# Patient Record
Sex: Female | Born: 2000 | Hispanic: Yes | Marital: Married | Smoking: Never smoker
Health system: Southern US, Community
[De-identification: ages and names within clinical notes are randomized; demographics above are authoritative.]

## PROBLEM LIST (undated history)

## (undated) DIAGNOSIS — Z789 Other specified health status: Secondary | ICD-10-CM

## (undated) DIAGNOSIS — O24419 Gestational diabetes mellitus in pregnancy, unspecified control: Secondary | ICD-10-CM

## (undated) HISTORY — DX: Other specified health status: Z78.9

## (undated) HISTORY — PX: WISDOM TOOTH EXTRACTION: SHX21

---
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
## 2021-04-07 ENCOUNTER — Other Ambulatory Visit: Payer: Self-pay

## 2021-04-07 ENCOUNTER — Ambulatory Visit (INDEPENDENT_AMBULATORY_CARE_PROVIDER_SITE_OTHER): Payer: Medicaid Other

## 2021-04-07 VITALS — BP 137/77 | HR 88 | Ht 65.0 in | Wt 230.1 lb

## 2021-04-07 DIAGNOSIS — Z3400 Encounter for supervision of normal first pregnancy, unspecified trimester: Secondary | ICD-10-CM | POA: Insufficient documentation

## 2021-04-07 DIAGNOSIS — O3680X0 Pregnancy with inconclusive fetal viability, not applicable or unspecified: Secondary | ICD-10-CM

## 2021-04-07 DIAGNOSIS — Z3401 Encounter for supervision of normal first pregnancy, first trimester: Secondary | ICD-10-CM | POA: Diagnosis not present

## 2021-04-07 DIAGNOSIS — Z3A12 12 weeks gestation of pregnancy: Secondary | ICD-10-CM

## 2021-04-07 DIAGNOSIS — Z789 Other specified health status: Secondary | ICD-10-CM

## 2021-04-07 DIAGNOSIS — Z3A11 11 weeks gestation of pregnancy: Secondary | ICD-10-CM

## 2021-04-07 MED ORDER — BLOOD PRESSURE KIT DEVI: 1.0000 | 0 refills | Status: AC

## 2021-04-07 NOTE — Progress Notes (Signed)
Patient was assessed and managed by nursing staff during this encounter. I have reviewed the chart and agree with the documentation and plan. I have also made any necessary editorial changes.  Catalina Antigua, MD 04/07/2021 12:24 PM

## 2021-04-07 NOTE — Progress Notes (Signed)
PRENATAL INTAKE SUMMARY  Ms. Sova presents today New OB Nurse Interview.  OB History    Gravida  1   Para      Term      Preterm      AB      Living  0     SAB      IAB      Ectopic      Multiple      Live Births  0          I have reviewed the patient's medical, obstetrical, social, and family histories, medications, and available lab results.  SUBJECTIVE She has no unusual complaints  OBJECTIVE Initial Physical Exam (New OB)  GENERAL APPEARANCE: alert, well appearing   ASSESSMENT Normal pregnancy  PLAN Prenatal care to be completed at [facility] labs to be completed at [facility] Provider visit Baby Scripts Ordered Blood Pressure kit Ordered U/S performed today reveals single live IUP at 2w3dby CSandborn FHR 177 PHQ 9 score: 4 GAD 7 score: 1

## 2021-04-17 ENCOUNTER — Other Ambulatory Visit: Payer: Self-pay

## 2021-04-17 ENCOUNTER — Ambulatory Visit (INDEPENDENT_AMBULATORY_CARE_PROVIDER_SITE_OTHER): Payer: Medicaid Other | Admitting: Student

## 2021-04-17 ENCOUNTER — Other Ambulatory Visit (HOSPITAL_COMMUNITY)
Admission: RE | Admit: 2021-04-17 | Discharge: 2021-04-17 | Disposition: A | Discharge status: Home / Self Care | Payer: Medicaid Other | Source: Ambulatory Visit | Attending: Student | Admitting: Student

## 2021-04-17 ENCOUNTER — Encounter: Payer: Self-pay | Admitting: Student

## 2021-04-17 VITALS — BP 119/83 | HR 94 | Wt 233.0 lb

## 2021-04-17 DIAGNOSIS — Z3A12 12 weeks gestation of pregnancy: Secondary | ICD-10-CM | POA: Insufficient documentation

## 2021-04-17 DIAGNOSIS — Z3401 Encounter for supervision of normal first pregnancy, first trimester: Secondary | ICD-10-CM

## 2021-04-17 DIAGNOSIS — Z3400 Encounter for supervision of normal first pregnancy, unspecified trimester: Secondary | ICD-10-CM

## 2021-04-17 NOTE — Progress Notes (Signed)
NOB [redacted]w[redacted]d U/S on 04/07/21 Pt has B/P Cuff at home.  NOB Intake done on 04/07/21 PHQ9=4 GAD 7 = 1  Genetic Screening: Desires and wants to know gender.   CC: Back pain onset Saturday. Pt states pain was bearable. Pt states she applied a heat compress and took tylenol still notes a little pain at this time.

## 2021-04-17 NOTE — Progress Notes (Signed)
Patient ID: Kristin Rios, female   DOB: 2001/03/25, 20 y.o.   MRN: [id number]

## 2021-04-17 NOTE — Progress Notes (Signed)
  Subjective:    Kristin Rios is being seen today for her first obstetrical visit.  This is not a planned pregnancy. She is at [redacted]w[redacted]d gestation. Her obstetrical history is significant for nothing. . Relationship with FOB: not married but FOB is invovled. Patient would like to try  intend to breast feed. Pregnancy history fully reviewed. FOB is in the [employer].   Patient reports no complaints.  Review of Systems:   Review of Systems  Constitutional: Negative.   HENT: Negative.   Respiratory: Negative.   Cardiovascular: Negative.   Gastrointestinal: Negative.   Genitourinary: Negative.   Neurological: Negative.     Objective:     BP 119/83   Pulse 94   Wt 233 lb (105.7 kg)   LMP 01/25/2021   BMI 38.77 kg/m  Physical Exam Constitutional:      Appearance: Normal appearance.  HENT:     Right Ear: Tympanic membrane normal.  Cardiovascular:     Pulses: Normal pulses.  Pulmonary:     Effort: Pulmonary effort is normal.  Abdominal:     General: Abdomen is flat.  Genitourinary:    General: Normal vulva.  Musculoskeletal:        General: Normal range of motion.  Skin:    General: Skin is warm and dry.  Neurological:     Mental Status: She is alert.     Maternal Exam:  Introitus: Normal vulva.      Assessment:    Pregnancy: G1P0 Patient Active Problem List   Diagnosis Date Noted  . Supervision of normal first teen pregnancy 04/07/2021       Plan:     Initial labs drawn. Prenatal vitamins. Problem list reviewed and updated. AFP3 discussed: will order. Role of ultrasound in pregnancy discussed; fetal survey: ordered. Amniocentesis discussed: declined. Follow up in 4 weeks. 75% of 30 min visit spent on counseling and coordination of care.  -genetic testing today -pap not needed due to age -[place] appt ordered  Anticipatory guidance about prenatal visits given including labs, ultrasounds, and testing. Discussed usage of Babyscripts and virtual visits as  additional source of managing and completing prenatal visits in midst of coronavirus and pandemic.   Encouraged to complete MyChart Registration for her ability to review results, send requests, and have questions addressed.  The nature of [place] [facility] Healthcare/Faculty Practice with multiple MDs and Advanced Practice Providers was explained to patient; also emphasized that residents, students are part of our team. Routine obstetric precautions reviewed. Encouraged to seek out care at office or emergency room [facility] preferred) for urgent and/or emergent concerns.    Charlesetta Garibaldi [facility] 04/17/2021

## 2021-04-18 LAB — CERVICOVAGINAL ANCILLARY ONLY
Chlamydia: NEGATIVE
Comment: NEGATIVE
Comment: NEGATIVE
Comment: NORMAL
Neisseria Gonorrhea: NEGATIVE
Trichomonas: NEGATIVE

## 2021-04-19 LAB — CULTURE, OB URINE

## 2021-04-19 LAB — URINE CULTURE, OB REFLEX

## 2021-04-22 LAB — OBSTETRIC PANEL, INCLUDING HIV
Antibody Screen: NEGATIVE
Basophils Absolute: 0 10*3/uL (ref 0.0–0.2)
Basos: 0 %
EOS (ABSOLUTE): 0 10*3/uL (ref 0.0–0.4)
Eos: 1 %
HIV Screen 4th Generation wRfx: NONREACTIVE
Hematocrit: 38.4 % (ref 34.0–46.6)
Hemoglobin: 12.9 g/dL (ref 11.1–15.9)
Hepatitis B Surface Ag: NEGATIVE
Immature Grans (Abs): 0 10*3/uL (ref 0.0–0.1)
Immature Granulocytes: 0 %
Lymphocytes Absolute: 1.4 10*3/uL (ref 0.7–3.1)
Lymphs: 18 %
MCH: 29.7 pg (ref 26.6–33.0)
MCHC: 33.6 g/dL (ref 31.5–35.7)
MCV: 89 fL (ref 79–97)
Monocytes Absolute: 0.4 10*3/uL (ref 0.1–0.9)
Monocytes: 6 %
Neutrophils Absolute: 5.6 10*3/uL (ref 1.4–7.0)
Neutrophils: 75 %
Platelets: 296 10*3/uL (ref 150–450)
RBC: 4.34 x10E6/uL (ref 3.77–5.28)
RDW: 13 % (ref 11.7–15.4)
RPR Ser Ql: NONREACTIVE
Rh Factor: POSITIVE
Rubella Antibodies, IGG: 2.79 index (ref >0.99)
WBC: 7.5 10*3/uL (ref 3.4–10.8)

## 2021-04-22 LAB — HEPATITIS C ANTIBODY: Hep C Virus Ab: <0.1 s/co ratio (ref 0.0–0.9)

## 2021-04-25 ENCOUNTER — Encounter: Payer: Self-pay | Admitting: Obstetrics and Gynecology

## 2021-04-29 ENCOUNTER — Encounter: Payer: Self-pay | Admitting: Obstetrics and Gynecology

## 2021-05-13 ENCOUNTER — Other Ambulatory Visit: Payer: Self-pay

## 2021-05-13 MED ORDER — PREPLUS 27-1 MG PO TABS: 1.0000 | ORAL_TABLET | Freq: Every day | ORAL | 13 refills | Status: AC

## 2021-05-13 MED ORDER — PRENATAL 27-0.8 MG PO TABS: 1.0000 | ORAL_TABLET | Freq: Every day | ORAL | 5 refills | Status: DC

## 2021-05-15 ENCOUNTER — Other Ambulatory Visit: Payer: Self-pay

## 2021-05-15 ENCOUNTER — Ambulatory Visit (INDEPENDENT_AMBULATORY_CARE_PROVIDER_SITE_OTHER): Payer: Medicaid Other

## 2021-05-15 VITALS — BP 128/74 | HR 75 | Wt 228.6 lb

## 2021-05-15 DIAGNOSIS — O99210 Obesity complicating pregnancy, unspecified trimester: Secondary | ICD-10-CM

## 2021-05-15 DIAGNOSIS — Z3400 Encounter for supervision of normal first pregnancy, unspecified trimester: Secondary | ICD-10-CM

## 2021-05-15 DIAGNOSIS — Z3A16 16 weeks gestation of pregnancy: Secondary | ICD-10-CM

## 2021-05-15 MED ORDER — ASPIRIN EC 81 MG PO TBEC: 81.0000 mg | DELAYED_RELEASE_TABLET | Freq: Every day | ORAL | 2 refills | Status: DC

## 2021-05-15 NOTE — Progress Notes (Signed)
   LOW-RISK PREGNANCY OFFICE VISIT  Patient name: Kristin Rios MRN [id number]  Date of birth: Apr 27, 2001 Chief Complaint:   Routine Prenatal Visit  Subjective:   Kristin Rios is a 20 y.o. G1P0 female at [redacted]w[redacted]d with an Estimated Date of Delivery: 10/24/21 being seen today for ongoing management of a low-risk pregnancy aeb has Supervision of normal first teen pregnancy on their problem list.  Patient presents today with no complaints.  Patient endorses fetal movement. Patient denies abdominal cramping or contractions.  Patient denies vaginal concerns including abnormal discharge, leaking of fluid, and bleeding.  Contractions: Not present. Vag. Bleeding: None.  Movement: Present.  Patient questions if she will have to pay out of pocket costs for her delivery.   Reviewed past medical,surgical, social, obstetrical and family history as well as problem list, medications and allergies.  Objective   Vitals:   05/15/21 1108  BP: 128/74  Pulse: 75  Weight: 228 lb 9.6 oz (103.7 kg)  Body mass index is 38.04 kg/m.  Total Weight Gain:9 lb 9.6 oz (4.355 kg)         Physical Examination:   General appearance: Well appearing, and in no distress  Mental status: Alert, oriented to person, place, and time  Skin: Warm & dry  Cardiovascular: Normal heart rate noted  Respiratory: Normal respiratory effort, no distress  Abdomen: Soft, gravid, nontender,   Pelvic: Cervical exam deferred           Extremities: Edema: None  Fetal Status: Fetal Heart Rate (bpm): 154  Movement: Present   No results found for this or any previous visit (from the past 24 hour(s)).  Assessment & Plan:  Low-risk pregnancy of a 20 y.o., G1P0 at [redacted]w[redacted]d with an Estimated Date of Delivery: 10/24/21   1. Encounter for supervision of normal pregnancy in teen primigravida, antepartum -Anticipatory guidance for upcoming appts. -Patient to schedule next appt in 4 weeks for an in-person visit. -AFP completed  today. -[place] scheduled for June 24th.   2. [redacted] weeks gestation of pregnancy -Doing well. -Informed that due to insurance [facility]) she will likely not have any OOP costs for delivery. -Reviewed who to contact and where to go for emergencies.  Given map to [facility].  3. Obesity in pregnancy -Reviewed risks factors for PreEclampsia including Hispanic Ethnicity, Primigravida, and BMI >30. -Reviewed research and recommendation for bASA initiation to reduce risk of PreE during pregnancy. -Will obtain HgB A1C today.     Meds:  Meds ordered this encounter  Medications   aspirin EC 81 MG tablet    Sig: Take 1 tablet (81 mg total) by mouth daily.    Dispense:  60 tablet    Refill:  2    Order Specific Question:   Supervising Provider    Answer:   Reva Bores [[id number]]   Labs/procedures today:  Lab Orders  AFP, Serum, Open Spina Bifida  Hemoglobin A1c    Reviewed: Preterm labor symptoms and general obstetric precautions including but not limited to vaginal bleeding, contractions, leaking of fluid and fetal movement were reviewed in detail with the patient.  All questions were answered.  Follow-up: Return in about 4 weeks (around 06/12/2021) for LROB.  Orders Placed This Encounter  Procedures   AFP, Serum, Open Spina Bifida   Hemoglobin A1c   Cherre Robins MSN, CNM 05/15/2021

## 2021-05-15 NOTE — Patient Instructions (Addendum)
Alpha-Fetoprotein Test Why am I having this test? The alpha-fetoprotein test is a lab test most commonly used for pregnant women to help screen for birth defects in their unborn baby. It can be used to screen for chromosome (DNA) abnormalities, problems with the brain or spinal cord, or problems with the abdominal wall of the unborn baby (fetus). The alpha-fetoprotein test may also be done for men or nonpregnant women to check for certain cancers. What is being tested? This test measures the amount of alpha-fetoprotein (AFP) in your blood. AFP is a protein that is made by the liver. Levels can be detected in the mother's blood during pregnancy, starting at 10 weeks and peaking at 16-18 weeks of the pregnancy. Abnormal levels can sometimes be a sign of a birth defect in the baby. Certain cancers can cause a high level of AFP in men and nonpregnant women. What kind of sample is taken? A blood sample is required for this test. It is usually collected by inserting a needle into a blood vessel.   How are the results reported? Your test results will be reported as values. Your health care provider will compare your results to normal ranges that were established after testing a large group of people (reference values). Reference values may vary among labs and hospitals. For this test, common reference values are:  Adult: Less than 40 ng/mL or less than 40 mcg/L (SI units).  Child younger than 1 year: Less than 30 ng/mL. If you are pregnant, the values may also vary based on how long you have been pregnant. What do the results mean? Results that are above the reference values in pregnant women may indicate the following for the baby:  Neural tube defects, such as abnormalities of the spinal cord or brain.  Abdominal wall defects.  Multiple pregnancy such as twins.  Fetal distress or fetal death. Results that are above the reference values in men or nonpregnant women may indicate:  Reproductive  cancers, such as ovarian or testicular cancer.  Liver cancer.  Liver cell death.  Other types of cancer. Very low levels of AFP in pregnant women may indicate Down syndrome for the baby. Talk with your health care provider about what your results mean. Questions to ask your health care provider Ask your health care provider, or the department that is doing the test:  When will my results be ready?  How will I get my results?  What are my treatment options?  What other tests do I need?  What are my next steps? Summary  The alpha-fetoprotein test is done on pregnant women to help screen for birth defects in their unborn baby.  Certain cancers can cause a high level of AFP in men and nonpregnant women.  For this test, a blood sample is usually collected by inserting a needle into a blood vessel.  Talk with your health care provider about what your results mean. This information is not intended to replace advice given to you by your health care provider. Make sure you discuss any questions you have with your health care provider. Document Revised: 06/14/2020 Document Reviewed: 06/14/2020 Elsevier Patient Education  2021 Elsevier Inc.  

## 2021-05-15 NOTE — Progress Notes (Signed)
ROB 16.6wks No complaints today. AFP pended.

## 2021-05-17 LAB — AFP, SERUM, OPEN SPINA BIFIDA
AFP MoM: 0.76
AFP Value: 22.3 ng/mL
Gest. Age on Collection Date: 16.6 weeks
Maternal Age At EDD: 19.8 yr
OSBR Risk 1 IN: 10000
Test Results:: NEGATIVE
Weight: 228 [lb_av]

## 2021-05-17 LAB — HEMOGLOBIN A1C
Est. average glucose Bld gHb Est-mCnc: 105 mg/dL
Hgb A1c MFr Bld: 5.3 % (ref 4.8–5.6)

## 2021-05-30 ENCOUNTER — Other Ambulatory Visit: Payer: Self-pay | Admitting: *Deleted

## 2021-05-30 ENCOUNTER — Other Ambulatory Visit: Payer: Self-pay

## 2021-05-30 ENCOUNTER — Other Ambulatory Visit: Payer: Self-pay | Admitting: Student

## 2021-05-30 ENCOUNTER — Ambulatory Visit: Payer: Medicaid Other | Attending: Student

## 2021-05-30 DIAGNOSIS — O99212 Obesity complicating pregnancy, second trimester: Secondary | ICD-10-CM | POA: Diagnosis not present

## 2021-05-30 DIAGNOSIS — Z363 Encounter for antenatal screening for malformations: Secondary | ICD-10-CM | POA: Diagnosis present

## 2021-05-30 DIAGNOSIS — Z3A12 12 weeks gestation of pregnancy: Secondary | ICD-10-CM

## 2021-05-30 DIAGNOSIS — E669 Obesity, unspecified: Secondary | ICD-10-CM | POA: Diagnosis not present

## 2021-05-30 DIAGNOSIS — R638 Other symptoms and signs concerning food and fluid intake: Secondary | ICD-10-CM

## 2021-05-30 DIAGNOSIS — Z3A19 19 weeks gestation of pregnancy: Secondary | ICD-10-CM | POA: Diagnosis not present

## 2021-06-06 ENCOUNTER — Encounter: Payer: Self-pay | Admitting: Obstetrics and Gynecology

## 2021-06-06 ENCOUNTER — Other Ambulatory Visit: Payer: Self-pay

## 2021-06-06 ENCOUNTER — Ambulatory Visit (INDEPENDENT_AMBULATORY_CARE_PROVIDER_SITE_OTHER): Payer: Medicaid Other | Admitting: Obstetrics and Gynecology

## 2021-06-06 VITALS — BP 119/74 | HR 79 | Wt 232.9 lb

## 2021-06-06 DIAGNOSIS — O99210 Obesity complicating pregnancy, unspecified trimester: Secondary | ICD-10-CM | POA: Insufficient documentation

## 2021-06-06 DIAGNOSIS — Z3402 Encounter for supervision of normal first pregnancy, second trimester: Secondary | ICD-10-CM

## 2021-06-06 NOTE — Progress Notes (Signed)
   PRENATAL VISIT NOTE  Subjective:  Kristin Rios is a 20 y.o. G1P0 at [redacted]w[redacted]d being seen today for ongoing prenatal care.  She is currently monitored for the following issues for this low-risk pregnancy and has Supervision of normal first teen pregnancy and Maternal obesity affecting pregnancy, antepartum on their problem list.  Patient reports no complaints.  Contractions: Not present. Vag. Bleeding: None.  Movement: Present. Denies leaking of fluid.   The following portions of the patient's history were reviewed and updated as appropriate: allergies, current medications, past family history, past medical history, past social history, past surgical history and problem list.   Objective:   Vitals:   06/06/21 0956  BP: 119/74  Pulse: 79  Weight: 232 lb 14.4 oz (105.6 kg)    Fetal Status: Fetal Heart Rate (bpm): 144   Movement: Present     General:  Alert, oriented and cooperative. Patient is in no acute distress.  Skin: Skin is warm and dry. No rash noted.   Cardiovascular: Normal heart rate noted  Respiratory: Normal respiratory effort, no problems with respiration noted  Abdomen: Soft, gravid, appropriate for gestational age.  Pain/Pressure: Absent     Pelvic: Cervical exam deferred        Extremities: Normal range of motion.  Edema: None  Mental Status: Normal mood and affect. Normal behavior. Normal judgment and thought content.   Assessment and Plan:  Pregnancy: G1P0 at [redacted]w[redacted]d 1. Supervision of normal first teen pregnancy in second trimester Patient is doing well without complaints   2. Maternal obesity affecting pregnancy, antepartum Continue ASA Follow up growth ultrasound scheduled  Preterm labor symptoms and general obstetric precautions including but not limited to vaginal bleeding, contractions, leaking of fluid and fetal movement were reviewed in detail with the patient. Please refer to After Visit Summary for other counseling recommendations.   No follow-ups  on file.  Future Appointments  Date Time Provider Department Center  06/27/2021  9:30 AM [facility] NURSE [facility]  06/27/2021  9:45 AM [facility] US5 [facility]    Catalina Antigua, MD

## 2021-06-06 NOTE — Progress Notes (Signed)
ROB 20 wks Reports left side abdominal pain and low abdominal pain yesterday-short duration. No pain now.

## 2021-06-27 ENCOUNTER — Ambulatory Visit: Payer: Medicaid Other

## 2021-07-15 ENCOUNTER — Ambulatory Visit (INDEPENDENT_AMBULATORY_CARE_PROVIDER_SITE_OTHER): Payer: Medicaid Other | Admitting: Obstetrics and Gynecology

## 2021-07-15 ENCOUNTER — Other Ambulatory Visit: Payer: Self-pay

## 2021-07-15 DIAGNOSIS — Z3402 Encounter for supervision of normal first pregnancy, second trimester: Secondary | ICD-10-CM

## 2021-07-15 NOTE — Progress Notes (Signed)
+   fetal movement. No complaints.  

## 2021-07-15 NOTE — Progress Notes (Signed)
   PRENATAL VISIT NOTE  Subjective:  Kristin Rios is a 20 y.o. G1P0 at [redacted]w[redacted]d being seen today for ongoing prenatal care.  She is currently monitored for the following issues for this low-risk pregnancy and has Supervision of normal first teen pregnancy and Maternal obesity affecting pregnancy, antepartum on their problem list.  Patient reports no complaints.  Contractions: Not present. Vag. Bleeding: None.  Movement: Present. Denies leaking of fluid.   The following portions of the patient's history were reviewed and updated as appropriate: allergies, current medications, past family history, past medical history, past social history, past surgical history and problem list.   Objective:   Vitals:   07/15/21 1114  BP: 133/79  Pulse: (!) 101  Weight: 247 lb (112 kg)    Fetal Status: Fetal Heart Rate (bpm): 151 Fundal Height: 25 cm Movement: Present     General:  Alert, oriented and cooperative. Patient is in no acute distress.  Skin: Skin is warm and dry. No rash noted.   Cardiovascular: Normal heart rate noted  Respiratory: Normal respiratory effort, no problems with respiration noted  Abdomen: Soft, gravid, appropriate for gestational age.  Pain/Pressure: Present     Pelvic: Cervical exam deferred        Extremities: Normal range of motion.  Edema: None  Mental Status: Normal mood and affect. Normal behavior. Normal judgment and thought content.   Assessment and Plan:  Pregnancy: G1P0 at [redacted]w[redacted]d  1. Supervision of normal first teen pregnancy in second trimester  Doing well [place] cone healthy baby website 2 hour GTT next visit.    Preterm labor symptoms and general obstetric precautions including but not limited to vaginal bleeding, contractions, leaking of fluid and fetal movement were reviewed in detail with the patient. Please refer to After Visit Summary for other counseling recommendations.   Return in about 4 weeks (around 08/12/2021), or For 2 hour GTT.  Future  Appointments  Date Time Provider Department Center  07/31/2021  8:30 AM [facility] NURSE [facility]  07/31/2021  8:45 AM [facility] US4 [facility]  08/13/2021  8:20 AM [facility] None    Venia Carbon, NP

## 2021-07-31 ENCOUNTER — Ambulatory Visit: Payer: Medicaid Other | Attending: Obstetrics and Gynecology

## 2021-07-31 ENCOUNTER — Other Ambulatory Visit: Payer: Self-pay | Admitting: Obstetrics and Gynecology

## 2021-07-31 ENCOUNTER — Encounter: Payer: Self-pay | Admitting: *Deleted

## 2021-07-31 ENCOUNTER — Other Ambulatory Visit: Payer: Self-pay

## 2021-07-31 ENCOUNTER — Ambulatory Visit: Payer: Medicaid Other | Admitting: *Deleted

## 2021-07-31 VITALS — BP 126/67 | HR 72

## 2021-07-31 DIAGNOSIS — R638 Other symptoms and signs concerning food and fluid intake: Secondary | ICD-10-CM

## 2021-07-31 DIAGNOSIS — O99212 Obesity complicating pregnancy, second trimester: Secondary | ICD-10-CM | POA: Diagnosis present

## 2021-07-31 DIAGNOSIS — O99210 Obesity complicating pregnancy, unspecified trimester: Secondary | ICD-10-CM

## 2021-07-31 DIAGNOSIS — Z3402 Encounter for supervision of normal first pregnancy, second trimester: Secondary | ICD-10-CM | POA: Insufficient documentation

## 2021-07-31 DIAGNOSIS — Z3A27 27 weeks gestation of pregnancy: Secondary | ICD-10-CM | POA: Diagnosis not present

## 2021-07-31 DIAGNOSIS — E669 Obesity, unspecified: Secondary | ICD-10-CM | POA: Diagnosis not present

## 2021-08-13 ENCOUNTER — Other Ambulatory Visit: Payer: Self-pay

## 2021-08-13 ENCOUNTER — Ambulatory Visit (INDEPENDENT_AMBULATORY_CARE_PROVIDER_SITE_OTHER): Payer: Medicaid Other | Admitting: Medical

## 2021-08-13 ENCOUNTER — Encounter: Payer: Self-pay | Admitting: Medical

## 2021-08-13 ENCOUNTER — Other Ambulatory Visit: Payer: Medicaid Other

## 2021-08-13 VITALS — BP 129/78 | HR 94 | Wt 246.8 lb

## 2021-08-13 DIAGNOSIS — O99213 Obesity complicating pregnancy, third trimester: Secondary | ICD-10-CM

## 2021-08-13 DIAGNOSIS — Z23 Encounter for immunization: Secondary | ICD-10-CM

## 2021-08-13 DIAGNOSIS — Z3A29 29 weeks gestation of pregnancy: Secondary | ICD-10-CM

## 2021-08-13 DIAGNOSIS — O24410 Gestational diabetes mellitus in pregnancy, diet controlled: Secondary | ICD-10-CM

## 2021-08-13 DIAGNOSIS — O99210 Obesity complicating pregnancy, unspecified trimester: Secondary | ICD-10-CM

## 2021-08-13 DIAGNOSIS — Z3402 Encounter for supervision of normal first pregnancy, second trimester: Secondary | ICD-10-CM

## 2021-08-13 NOTE — Progress Notes (Signed)
ROB 29.[redacted] wks GA GTT, CBC, HIV, RPR today TDAP offered and accepted Blood type B+ Depression and anxiety screen negative.

## 2021-08-13 NOTE — Progress Notes (Signed)
   PRENATAL VISIT NOTE  Subjective:  Kristin Rios is a 20 y.o. G1P0 at [redacted]w[redacted]d being seen today for ongoing prenatal care.  She is currently monitored for the following issues for this low-risk pregnancy and has Supervision of normal first teen pregnancy and Maternal obesity affecting pregnancy, antepartum on their problem list.  Patient reports no complaints.  Contractions: Not present. Vag. Bleeding: None.  Movement: Present. Denies leaking of fluid.   The following portions of the patient's history were reviewed and updated as appropriate: allergies, current medications, past family history, past medical history, past social history, past surgical history and problem list.   Objective:   Vitals:   08/13/21 0946  BP: 129/78  Pulse: 94  Weight: 246 lb 12.8 oz (111.9 kg)    Fetal Status: Fetal Heart Rate (bpm): 140   Movement: Present     General:  Alert, oriented and cooperative. Patient is in no acute distress.  Skin: Skin is warm and dry. No rash noted.   Cardiovascular: Normal heart rate noted  Respiratory: Normal respiratory effort, no problems with respiration noted  Abdomen: Soft, gravid, appropriate for gestational age.  Pain/Pressure: Absent     Pelvic: Cervical exam deferred        Extremities: Normal range of motion.  Edema: Trace  Mental Status: Normal mood and affect. Normal behavior. Normal judgment and thought content.   Assessment and Plan:  Pregnancy: G1P0 at [redacted]w[redacted]d 1. Supervision of normal first teen pregnancy in second trimester - Glucose Tolerance, 2 Hours w/1 Hour - CBC - HIV Antibody (routine testing w rflx) - RPR - Tdap vaccine greater than or equal to 7yo IM - Peds list given  - CBE website info given   2. Maternal obesity affecting pregnancy, antepartum - BASA  3. [redacted] weeks gestation of pregnancy  Preterm labor symptoms and general obstetric precautions including but not limited to vaginal bleeding, contractions, leaking of fluid and fetal  movement were reviewed in detail with the patient. Please refer to After Visit Summary for other counseling recommendations.   Return in about 2 weeks (around 08/27/2021) for LOB, In-Person.  Future Appointments  Date Time Provider Department Center  09/11/2021  1:30 PM [facility] NURSE [facility]  09/11/2021  1:45 PM [facility] US6 [facility]    Vonzella Nipple, PA-C

## 2021-08-13 NOTE — Patient Instructions (Signed)
AREA PEDIATRIC/FAMILY PRACTICE PHYSICIANS  Central/[place]outheast [place] ([address]) [facility] Chambliss, MD; Eniola, MD; Hale, MD; Hensel, MD; McDiarmid, MD; McIntyer, MD; Neal, MD; Walden, MD [address] [place], [place] [address] [phone] Mon-Fri 8:30-12:30, 1:30-5:00 Providers come to see babies at [facility] Accepting Medicaid [facility] providers who accept newborns: Koirala, MD; Morrow, MD; Wolters, MD [address], [place], [place] [address] [phone] Mon-Fri 8:00-5:30 Babies seen by providers at [facility] Does NOT accept Medicaid Please call early in hospitalization for appointment (limited availability)  [facility] Mulberry, MD [address] [place], [place] [address] [phone] Mon, Tue, Thur, Fri 8:30-5:00, Wed 10:00-7:00 (closed 1-2pm) Babies seen by [facility] providers Accepting Medicaid Rubin - Pediatrician Rubin, MD [address], [place], [place] [address] [phone] Mon-Fri 8:30-5:00, Sat 8:30-12:00 Provider comes to see babies at [facility] Accepting Medicaid Must have been referred from current patients or contacted office prior to delivery [facility] ([facility]) Brown, MD; Chandler, MD; Ettefagh, MD; Grant, MD; Lester, MD; McCormick, MD; McQueen, MD; Prose, MD; Simha, MD; Stanley, MD; Stryffeler, NP; Tebben, NP [address], [place], [place] [address] [phone] Mon, Tue, Thur, Fri 8:30-5:30, Wed 9:30-5:30, Sat 8:30-12:30 Babies seen by [facility] providers Accepting Medicaid Only accepting infants of first-time parents or siblings of current patients Hospital discharge coordinator will make follow-up appointment Jack Amos [address], [place], [place]  [address] [phone]   Fax - [phone] [facility] [address], [place], [place]  [address] Phone - [phone]   Fax - [phone] Shilpa Gosrani [address], [place], [place]  [address] [phone]  East/[place]ortheast [place] ([address]) [facility] Bates, MD; [place], MD; Cooper, Cox, MD; MD; Davis, MD; Dovico, MD; Ettefaugh, MD; Little, MD; Lowe, MD; Keiffer, MD; Melvin, MD; Sumner, MD; Williams, MD [address], [place], [place] [address] [phone] Mon-Fri 8:30-5:00 (extended evenings Mon-Thur as needed), Sat-Sun 10:00-1:00 Providers come to see babies at [facility] Accepting Medicaid for families of first-time babies and families with all children in the household age 3 and under. Must register with office prior to making appointment (M-F only). [facility] Henson, NP; Knapp, MD; Lalonde, MD; Tysinger, PA [address] [place], [place] [address] [phone] Mon-Fri 8:00-5:00 Babies seen by providers at [facility] Does NOT accept Medicaid/Commercial Insurance Only [facility] ([facility])  Artis, MD; Barnes, MD; Bratton, MD; Coccaro, MD; Lockett Gardner, MD; Kramer, MD; Marshall, MD; Netherton, MD; Poleto, MD; Skinner, MD [address] [place], [place] [address] [phone] Mon-Fri 8:30-5:30, Sat (Oct.-Mar.) 9:00-1:00 Babies seen by providers at [facility] Accepting Medicaid  [place] ([address]) [facility] Reid, MD; Warner, MD [address], [place], [place] [address] [phone] Mon-Fri 8:30-5:00, Sat 8:30-12:00 Providers come to see babies at [facility] Does NOT accept Medicaid [facility] Becker, PA; Hagler, MD; Scifres, PA; Sun, MD; Swayne, MD [address], [place], [place] [address] [phone] Mon-Fri 8:00-5:00 Babies seen by providers at [facility] Does NOT accept Medicaid Only accepting babies of parents who  are patients Please call early in hospitalization for appointment (limited availability) [facility] Clark, MD; Frye, MD; Kelleher, MD; Mack, NP; [place], MD; O'Keller, MD; Patterson, NP; Pudlo, MD; Puzio, MD; Thomas, MD; Tucker, MD; Twiselton, MD [address], [place], [place] [address] [phone] Mon-Fri 8:00-5:00, Sat 9:00-12:00 Providers come to see babies at [facility] Does NOT accept Medicaid  [place] ([address]) [facility] providers accepting new patients: Brake, NP; Wharton, PA [address], [place], [place] [address] [phone] Mon-Fri 8:00-5:00 Babies seen by providers at [facility] Does NOT accept Medicaid Only accepting babies of parents who are patients Please call early in hospitalization for appointment (limited availability) [facility] Gay, MD; Quinlan, MD [address] [place], [place] [address] [phone] (press 1 to schedule appointment) Mon-Fri 8:00-5:00 Providers come to see babies at [facility] Does NOT accept Medicaid [facility] Mazer, MD [address] [place], [place] [address] [phone] Mon-Fri 8:30-5:00 (lunch 12:30-1:00), extended hours by appointment only Wed 5:00-6:30 Babies seen by [facility] providers Accepting Medicaid [facility] Banks, MD; Jordan, MD; Koberlein, MD [address], [place], [place] [address] [phone] Mon-Fri 8:00-5:00 Babies seen by [facility] providers Does NOT accept Medicaid [facility] Parker, MD; Hunter, MD; Wallace, DO [address] [place], [place] [address] [phone] Mon-Fri 8:00-5:00 Babies seen by [facility] providers Does NOT accept Medicaid [facility] Brandon, PA; Brecken, PA; Christy, NP; Dees, MD; DeClaire, MD; DeWeese, MD; Hansen, NP; Mills, NP; Parrish, NP; Smoot, NP; Summer, MD; Vapne,  MD [address] [place], [place] [address] [phone] Mon-Fri 8:30-5:00, Sat 10:00-1:00 Providers come to see babies at [facility] Does NOT accept Medicaid Free prenatal information session Tuesdays at 4:45pm [facility] Bouska, MD; Gordon, PA; Jeffery, PA; Weber, PA [address] [place] [address] [phone] Mon-Fri 7:30-5:30 Babies seen by [facility] providers [facility] [address], [place], [place]  [address] [phone]   Fax - [phone]  [place] ([address] & [address]) [facility] [place] [address] [place], [place] [address] [phone] Mon-Thur 8:00-6:00 Providers come to see babies at [facility] Accepting Medicaid [facility] Anderson, NP; Badger, MD; Beal, PA; Spencer, PA [address] [place], [place] [address] [phone] Mon-Thur 7:30-7:30, Fri 7:30-4:30 Babies seen by [facility] providers Accepting Medicaid [facility] Agbuya, MD; Klett, NP; Romgoolam, MD [address], [place], [place] [address] [phone] Mon-Fri 8:30-5:00, Sat 8:30-12:00 Providers come to see babies at [facility] Accepting Medicaid Must have "Meet & Greet" appointment at office prior to delivery [facility] ([facility] McCord, MD; Wallace, MD; Wood, MD [address], [place], [place] [address] [phone] Mon-Wed 8:00-6:00, Thur-Fri 8:00-5:00, Sat 9:00-12:00 Providers come to see babies at [facility] Does NOT accept Medicaid Only accepting siblings of current patients [facility]  [address], [place], [place]  [address] [phone]   Fax - [phone] [facility] [address], [place], [place]  [address] [phone]   Fax -  [phone]  [place] ([address] & [address]) [facility] Cirigliano, DO; Matthews, DO [address] [place],  [address] [phone] Mon-Fri 7:00-5:00 Babies seen by [facility] providers Does NOT accept Medicaid [facility] Briscoe, MD; Howley, PA; [place]ira, PA [address], [place],  [address] [phone] Mon-Fri 8:00-5:00 Babies seen by [facility] providers Accepting Medicaid [facility] Boyd, MD; Church, PA; Jones, NP; Osborn, PA [address], [place],  [address] [phone] Mon-Fri 8:00-5:00 Babies seen by providers at [facility] Accepting Medicaid  [place] ([address]) [facility] [place] Wendling, DO [address] [place], [place] [address] [phone] Mon-Fri 8:00-5:00 Babies seen by [facility] providers Does NOT accept Medicaid Limited availability, please call early in hospitalization to schedule follow-up [facility] Calderon, PA; Cummings, MD; Dillard, MD; Martin, PA; Olson, MD; VanDeven, PA [address], [place], [place] [address] [phone] Mon-Fri 8:30-5:00, Sat 9:00-12:00 Babies seen by providers at [facility] Accepting Medicaid Please register online then schedule online or call office [email or web address] Hunter, NP; Kumar, MD; Martin Rogers, PA [address], [place], [place] [address] [phone] Mon-Fri 8:00-5:00 Babies seen by providers at [facility] Accepting Medicaid [facility] ([facility]) [place], MD; Kristi Fleenor, NP; West, MD [address], [place], [place] [address] [phone] Mon-Fri 8:00-5:30, Sat&Sun by appointment (phones open at  8:30) Babies seen by [facility] providers Accepting Medicaid Must be a first-time baby or sibling of current patient [facility] [place]  [address], [place], [place]  [address] [phone]   Fax - [phone]  [place] ([address]) [facility] Brown, PA; Cowen, PA; Rice, MD; Helton, PA; Spry, MD [address] [place], [place] [address] [phone] Mon-Thur 8:00-7:00, Fri 8:00-5:00, Sat 8:00-12:00, Sun 9:00-12:00 Babies seen by [facility] providers Accepting Medicaid [facility] Coe-Goins, MD; Marshall, MD; Pierre-Louis, MD [address] [place] [address], [place], [place] [address] [phone] Mon-Thur 8:00-5:00 Babies seen by providers at [facility] Accepting Medicaid [facility] Bratton, MD; Coe-Goins, MD; Hayes, MD; Lewis, MD; List, MD; Lott, MD; Marshall, MD; Moran, MD; O'Neal, MD; Pierre-Louis, MD; Pitonzo, MD; Scholer, MD; Spangle, MD [address] [place], [place] [address] [phone] Mon-Fri 8:00-5:30, Sat (Oct.-Mar.) 9:00-1:00 Babies seen by providers at [facility] Accepting Medicaid Must fill out new patient packet, available online at [email or web address] [facility] ([facility]) Friddle, NP; Harris, NP; Kelly, NP; Logan, MD; Melvin, PA; Poth, MD; Ramadoss, MD; Stanton, NP [address], [place], [place] [address] [phone] Mon-Thur 8:00-5:30, Fri 8:00-5:00 Babies seen by providers at [facility] Accepting Medicaid  [place] ([address]) [facility] Dixon, PA; [place], MD; Pickard, MD; Tapia, PA [address], [place], [place] [address] [phone] Mon-Fri 8:00-5:00 Babies seen by providers at [facility] Accepting Medicaid   [place] ([address]) [facility] [place] Masneri, DO; Meyers, MD; Nelson, PA [address], [place], [place] [address] [phone] Mon-Fri 8:00-5:00 Babies seen by providers at [facility] Does NOT accept Medicaid Limited appointment availability, please call early in hospitalization  [facility] [place] Kunedd, DO; McGowen, MD [address], [place], [place] [address] [phone] Mon-Fri 8:00-5:00 Babies seen by [facility] providers Does NOT accept Medicaid [facility] [place] Cameron, MD; MacDonald, MD; Michaels, PA; Nayak, MD [address] [place] [address], [place], [place] [address] [phone] Mon-Fri 8:00-5:00 After hours clinic ([address] [place],  [address]) [phone] Mon-Fri 5:00-8:00, Sat 12:00-6:00, Sun 10:00-4:00 Babies seen by [facility] providers Accepting Medicaid [facility] [place] [address], [place], [place]  [address] [phone]   Fax - [phone]  [place] ([address]) [facility] [place] [facility] Andy, MD [address], [place], [place] [address] [phone] Mon-Fri 8:00-5:00 Babies seen by [facility] providers Does NOT accept Medicaid [facility] [place] ([facility] [place]) Eksir, MD [address], [place], [place] [address] [phone] Mon-Thur 8:00-7:00, Fri 8:00-5:00, Sat 8:00-12:00 Babies seen by providers at [facility] Accepting Medicaid - but does not have vaccinations in office (must be received elsewhere) Limited availability, please call early in hospitalization  [place] ([address]) [facility]  Charlene Flemming, MD [address], [place] [address] [phone]  Fax [phone]  [facility]  Kimberly Newton, MD, Annamarie Streilein, PA, Carla Hampton, PA [address] [place], [place]  [address] [phone] [facility]  [address], [place], [place] [address] [phone] [address], [place], [place] [address] [phone] (West Office)  [facility] [address], [place], [place] [address] [phone] [facility] [address], [place], [place] [address] [phone] [facility] [address], [place], [place] [address] [phone] [facility] [address], [place], [place] [address] [phone] [facility] [address], [place], [place] [address] [phone] [facility] [address], [place], [place] [address] [phone] [facility]  [address], [place], [place] [address] [phone] [facility] [address] [place] [address], [place], [place] [address] [phone] [facility] [address], [place], [place] [address] [phone] [facility] [address], [place],  [address] [phone]  Childbirth Education Options: [facility] Classes:  Childbirth education classes can help you get ready for a positive parenting experience. You can also meet other expectant parents and get free stuff for your baby. Each class runs for five weeks on the same night and costs $45 for the mother-to-be and her support person. Medicaid covers the cost if you are eligible. Call [phone] to register.  [facility] Childbirth Education: Classes can vary in availability and schedule is subject to change. For most up-to-date information please visit [email or web address] to review and register.        

## 2021-08-14 ENCOUNTER — Other Ambulatory Visit: Payer: Self-pay | Admitting: *Deleted

## 2021-08-14 DIAGNOSIS — O24419 Gestational diabetes mellitus in pregnancy, unspecified control: Secondary | ICD-10-CM

## 2021-08-14 LAB — CBC
Hematocrit: 38.2 % (ref 34.0–46.6)
Hemoglobin: 12.5 g/dL (ref 11.1–15.9)
MCH: 28 pg (ref 26.6–33.0)
MCHC: 32.7 g/dL (ref 31.5–35.7)
MCV: 86 fL (ref 79–97)
Platelets: 245 10*3/uL (ref 150–450)
RBC: 4.47 x10E6/uL (ref 3.77–5.28)
RDW: 13.1 % (ref 11.7–15.4)
WBC: 9.4 10*3/uL (ref 3.4–10.8)

## 2021-08-14 LAB — GLUCOSE TOLERANCE, 2 HOURS W/ 1HR
Glucose, 1 hour: 176 mg/dL (ref 65–179)
Glucose, 2 hour: 128 mg/dL (ref 65–152)
Glucose, Fasting: 98 mg/dL — ABNORMAL HIGH (ref 65–91)

## 2021-08-14 LAB — RPR: RPR Ser Ql: NONREACTIVE

## 2021-08-14 LAB — HIV ANTIBODY (ROUTINE TESTING W REFLEX): HIV Screen 4th Generation wRfx: NONREACTIVE

## 2021-08-14 MED ORDER — ACCU-CHEK GUIDE W/DEVICE KIT: PACK | 0 refills | Status: DC

## 2021-08-14 MED ORDER — ACCU-CHEK GUIDE VI STRP: ORAL_STRIP | 5 refills | Status: DC

## 2021-08-14 MED ORDER — ACCU-CHEK SOFTCLIX LANCETS MISC: 5 refills | Status: DC

## 2021-08-14 NOTE — Addendum Note (Signed)
Addended by: Marny Lowenstein on: 08/14/2021 03:13 PM   Modules accepted: Orders

## 2021-08-27 ENCOUNTER — Other Ambulatory Visit: Payer: Self-pay

## 2021-08-27 ENCOUNTER — Ambulatory Visit (INDEPENDENT_AMBULATORY_CARE_PROVIDER_SITE_OTHER): Payer: Medicaid Other | Admitting: Women's Health

## 2021-08-27 VITALS — BP 136/81 | HR 89 | Wt 247.0 lb

## 2021-08-27 DIAGNOSIS — Z23 Encounter for immunization: Secondary | ICD-10-CM | POA: Diagnosis not present

## 2021-08-27 DIAGNOSIS — O99210 Obesity complicating pregnancy, unspecified trimester: Secondary | ICD-10-CM

## 2021-08-27 DIAGNOSIS — Z3403 Encounter for supervision of normal first pregnancy, third trimester: Secondary | ICD-10-CM

## 2021-08-27 DIAGNOSIS — O24410 Gestational diabetes mellitus in pregnancy, diet controlled: Secondary | ICD-10-CM

## 2021-08-27 DIAGNOSIS — O99213 Obesity complicating pregnancy, third trimester: Secondary | ICD-10-CM

## 2021-08-27 DIAGNOSIS — Z3A31 31 weeks gestation of pregnancy: Secondary | ICD-10-CM

## 2021-08-27 NOTE — Progress Notes (Signed)
ROB.  C/o upper quadrant stomach pain 4/10 x 3 days.  FLU vaccine given in LD, tolerated well.  BS log on her phone.

## 2021-08-27 NOTE — Patient Instructions (Signed)
Maternity Assessment Unit (MAU)  The Maternity Assessment Unit (MAU) is located at the [facility]. The address is: [address] [place], [place], [place] [address]. Please see map below for additional directions.    The Maternity Assessment Unit is designed to help you during your pregnancy, and for up to 6 weeks after delivery, with any pregnancy- or postpartum-related emergencies, if you think you are in labor, or if your water has broken. For example, if you experience nausea and vomiting, vaginal bleeding, severe abdominal or pelvic pain, elevated blood pressure or other problems related to your pregnancy or postpartum time, please come to the Maternity Assessment Unit for assistance.       AREA PEDIATRIC/FAMILY PRACTICE PHYSICIANS  [facility] [address] [place], [place] [address] Phone - [phone]   Fax - [place] [address] [place], [place]  [address] Phone - [phone]   Fax - [phone]  [place] [address] [place] [address] [place], [place]  [address] Phone - [phone]   Fax - [phone]  [facility] [address] [place], [place]  [address] Phone - [phone]   Fax - [phone]  [place] [address], [place], [place]  [address] Phone - [phone]   Fax - [place] [address] [place], [place]  [address] Phone - [phone]   Fax - [place] [facility] [place] [address], [place], [place]  [address] Phone - [phone]   Fax - [phone]  [place] [address] [place], [place], [place]  [address] Phone - [phone]   Fax - [place] [address] [place], [place]  [address] Phone - [phone]   Fax -  [phone] [facility] [place]. [address] [place], [place]  [address] Phone - [phone]   Fax - [phone]  [place] [address] [place], [place]  [address] Phone - [phone]   Fax - [phone]  [facility] [place], [place], [place]  [address] Phone - [phone]   Fax - [place] [address], [place], [place]  [address] Phone - [phone]   Fax - [phone]  [facility] [address], [place], [place]  [address] Phone - [place], [place]  [address] Phone - [phone]   Fax - [place] [address], [place], [place]  [address] Phone - [phone]   Fax - [phone]  [place] [address] [place], [place]  [address] Phone - [phone]   Fax - [place]. [place], [place]  [address] Phone - [phone]   Fax - [place], [place], [place]  [address] Phone - [phone]   Fax - [place] [address], [address] [place], [place]  [address] Phone - [phone]   Fax - [phone]  [place]UB[place] [address] [place], [place]  [address] Phone - [phone]   Fax - [place] [address] [place],   [address] Phone - [phone]  Fax - [phone]  [facility] [address] [place], [place]  [address] Phone - [phone]   Fax - [phone] Ger[place] [id number] [place], [place]  [address] Phone - [phone]   Fax - [phone]  [facility] [address] [place], [place]  [address] Phone - [phone]   Fax - [phone]  [facility]  FAMILY [facility]IC[facility] - [place] [address] [place], [place]  [address] Phone - [phone]   Fax - [phone]        Preeclampsia and Eclampsia Preeclampsia is a serious condition that may develop during pregnancy. This condition involves high blood pressure during pregnancy and causes symptoms such as headaches, vision changes, and increased swelling in the legs, hands, and face. Preeclampsia occurs after 20 weeks of pregnancy. Eclampsia is a seizure that happens from worsening preeclampsia. Diagnosing and managing preeclampsia early is important. If not treated early, it can cause serious problems for mother and baby. There is no cure for this condition. However, during pregnancy, delivering the baby may be the best treatment for preeclampsia or eclampsia. For most women, symptoms of preeclampsia and eclampsia go away after giving birth. In rare cases, a woman may develop preeclampsia or eclampsia after giving birth. This usually occurs within 48 hours after childbirth but may occur up to 6 weeks after giving birth. What are the causes? The cause of this condition is not known. What increases the risk? The following factors make you more likely to develop preeclampsia: Being pregnant for the first time or being pregnant with multiples. Having had preeclampsia or a condition called hemolysis, elevated liver enzymes, and low platelet count (HELLP)syndrome during a past pregnancy. Having a family history of preeclampsia. Being older than age 97. Being obese. Becoming pregnant through fertility treatments. Conditions that reduce blood flow or oxygen to your placenta and baby may also increase your risk. These include: High blood pressure before, during, or immediately following pregnancy. Kidney disease. Diabetes. Blood clotting disorders. Autoimmune diseases, such as lupus. Sleep apnea. What are the signs or symptoms? Common symptoms of this condition include: A  severe, throbbing headache that does not go away. Vision problems, such as blurred or double vision and light sensitivity. Pain in the stomach, especially the right upper region. Pain in the shoulder. Other symptoms that may develop as the condition gets worse include: Sudden weight gain because of fluid buildup in the body. This causes swelling of the face, hands, legs, and feet. Severe nausea and vomiting. Urinating less than usual. Shortness of breath. Seizures. How is this diagnosed? Your health care provider will ask you about symptoms and check for signs of preeclampsia during your prenatal visits. You will also have routine tests, including: Checking your blood pressure. Urine tests to check for protein. Blood tests to assess your organ function. Monitoring your baby's heart rate. Ultrasounds to check fetal growth. How is this treated? You and your health care provider will determine the treatment that is best for you. Treatment may include: Frequent prenatal visits to check for preeclampsia. Medicine to lower your blood pressure. Medicine to prevent seizures. Low-dose aspirin during your pregnancy. Staying in the hospital, in severe cases. You will be given medicines to control your blood pressure and the amount of fluids in your body. Delivering your baby. Work with your health care provider to manage any chronic health conditions, such as diabetes or kidney problems. Also, work with your health care provider to manage weight gain  during pregnancy. Follow these instructions at home: Eating and drinking Drink enough fluid to keep your urine pale yellow. Avoid caffeine. Caffeine may increase blood pressure and heart rate and lead to dehydration. Reduce the amount of salt that you eat. Lifestyle Do not use any products that contain nicotine or tobacco. These products include cigarettes, chewing tobacco, and vaping devices, such as e-cigarettes. If you need help quitting, ask  your health care provider. Do not use alcohol or drugs. Avoid stress as much as possible. Rest and get plenty of sleep. General instructions  Take over-the-counter and prescription medicines only as told by your health care provider. When lying down, lie on your left side. This keeps pressure off your major blood vessels. When sitting or lying down, raise (elevate) your feet. Try putting pillows underneath your lower legs. Exercise regularly. Ask your health care provider what kinds of exercise are best for you. Check your blood pressure as often as recommended by your health care provider. Keep all prenatal and follow-up visits. This is important. Contact a health care provider if: You have symptoms that may need treatment or closer monitoring. These include: Headaches. Stomach pain or nausea and vomiting. Shoulder pain. Vision problems, such as spots in front of your eyes or blurry vision. Sudden weight gain or increased swelling in your face, hands, legs, and feet. Increased anxiety or feeling of impending doom. Signs or symptoms of labor. Get help right away if: You have any of the following symptoms: A seizure. Shortness of breath or trouble breathing. Trouble speaking or slurred speech. Fainting. Chest pain. These symptoms may represent a serious problem that is an emergency. Do not wait to see if the symptoms will go away. Get medical help right away. Call your local emergency services (911 in the U.S[place] Do not drive yourself to the hospital. Summary Preeclampsia is a serious condition that may develop during pregnancy. Diagnosing and treating preeclampsia early is very important. Keep all prenatal and follow-up visits. This is important. Get help right away if you have a seizure, shortness of breath or trouble breathing, trouble speaking or slurred speech, chest pain, or fainting. This information is not intended to replace advice given to you by your health care provider.  Make sure you discuss any questions you have with your health care provider. Document Revised: 08/15/2020 Document Reviewed: 08/15/2020 Elsevier Patient Education  2022 Elsevier Inc.       Gestational Diabetes Mellitus, Self-Care Caring for yourself after a diagnosis of gestational diabetes mellitus means keeping your blood sugar under control. This can be done through nutrition, exercise, lifestyle changes, insulin and other medicines, and support from your health care team. Your health care provider will set individualized treatment goals for you. What are the risks? If left untreated, gestational diabetes can cause problems for mother and baby. For the mother Women who get gestational diabetes are more likely to: Have labor induced and deliver early. Have problems during labor and delivery, if the baby is larger than normal. This includes difficult labor and damage to the birth canal. Have a cesarean delivery. Have problems with blood pressure, including high blood pressure and preeclampsia. Get it again if they become pregnant. Develop type 2 diabetes in the future. For the baby Gestational diabetes that is not treated can cause the baby to have: Low blood glucose (hypoglycemia). Larger-than-normal body size (macrosomia). Breathing problems. How to monitor blood glucose Check your blood glucose every day and as often as told by your health care provider. To  do this: Wash your hands with soap and water for at least 20 seconds. Prick the side of your finger (not the tip) with the lancet. Use a different finger each time. Gently rub the finger until a small drop of blood appears. Follow instructions that come with your meter for inserting the test strip, applying blood to the strip, and getting the result. Write down your result and any notes. Blood glucose goals are: 95 mg/dL (5.3 mmol/L) when fasting. 140 mg/dL (7.8 mmol/L) 1 hour after a meal. 120 mg/dL (6.7 mmol/L) 2 hours  after a meal. Follow these instructions at home: Medicines Take over-the-counter and prescription medicines only as told by your health care provider. If your health care provider prescribed insulin or other diabetes medicines: Take them every day. Do not run out of insulin or other medicines. Plan ahead so you always have them available. Eating and drinking  Follow instructions from your health care provider about eating or drinking restrictions. See a diet and nutrition expert (registered dietician) to help you create an eating plan that helps control your diabetes. The foods in this plan will include: Lean proteins. Complex carbohydrates. These are carbohydrates that contain fiber, have a lot of nutrients, and are digested slowly. They include dried beans, nuts, and whole grain breads, cereals, or pasta. Fresh fruits and vegetables. Low-fat dairy products. Healthy fats. Eat healthy snacks between nutritious meals. Drink enough fluid to keep your urine pale yellow. Keep a record of the carbohydrates that you eat. To do this: Read food labels. Learn the standard serving sizes of foods. Make a sick day plan with your health care provider before you get sick. Follow this plan whenever you cannot eat or drink as usual. Activity Do exercises as told by your health care provider. Do 30 or more minutes of physical activity a day, or as much physical activity as your health care provider recommends. It may help to control blood glucose levels after a meal if you: Do 10 minutes of exercise after each meal. Start this exercise 30 minutes after the meal. If you start a new exercise or activity, work with your health care provider to adjust your insulin, other medicines, or food as needed. Lifestyle Do not drink alcohol. Do not use any products that contain nicotine or tobacco, such as cigarettes, e-cigarettes, and chewing tobacco. If you need help quitting, ask your health care provider. Learn  to manage stress. If you need help with this, ask your health care provider. Body care Keep your vaccines up to date. Practice good oral hygiene. To do this: Clean your teeth and gums two times a day. Floss one or more times a day. Visit your dentist one or more times every 6 months. Stay at a healthy weight while you are pregnant. Your expected weight gain depends on your BMI (body mass index) before pregnancy. General instructions Talk with your health care provider about the risk for high blood pressure during pregnancy (preeclampsia and eclampsia). Share your diabetes management plan with people in your workplace, school, and household. Check your urine for ketones when sick and as told by your health care provider. Ketones are made by the liver when a lack of glucose forces the body to use fat for energy. Carry a medical alert card or wear medical alert jewelry that says you have gestational diabetes. Keep all follow-up visits. This is important. Get care after delivery Have your blood glucose level checked with an oral glucose tolerance test (OGTT) 4-12 weeks  after delivery. Get screened for diabetes at least every 3 years, or as often as told by your health care provider. Where to find more information American Diabetes Association (ADA): diabetes.org Association of Diabetes Care & Education Specialists (ADCES): diabeteseducator.org Centers for Disease Control and Prevention (CDC): TonerPromos.no American Pregnancy Association: americanpregnancy.org U.S. Department of Agriculture MyPlate: WrestlingReporter.dk Contact a health care provider if: Your blood glucose is above your target for two tests in a row. You have a fever. You have been sick for 2 days or more and are not getting better. You have either of these problems for more than 6 hours: Vomiting every time you eat or drink. Diarrhea. Get help right away if you: Become confused or cannot think clearly. Have trouble breathing. Have  moderate or high ketones in your urine. Feel your baby is not moving as usual. Develop unusual discharge or bleeding from your vagina. Start having early (premature) contractions. Contractions may feel like a tightening in your lower abdomen Have a severe headache. These symptoms may represent a serious problem that is an emergency. Do not wait to see if the symptoms will go away. Get medical help right away. Call your local emergency services (911 in the U.S.). Do not drive yourself to the hospital. Summary Check your blood glucose every day during your pregnancy. Do this as often as told by your health care provider. Take insulin or other diabetes medicines every day, if your health care provider prescribed them. Have your blood glucose level checked 4-12 weeks after delivery. Keep all follow-up visits. This is important. This information is not intended to replace advice given to you by your health care provider. Make sure you discuss any questions you have with your health care provider. Document Revised: 04/29/2020 Document Reviewed: 04/29/2020 Elsevier Patient Education  2022 [employer].

## 2021-08-27 NOTE — Progress Notes (Signed)
Subjective:  Kristin Rios is a 20 y.o. G1P0 at 52w5dbeing seen today for ongoing prenatal care.  She is currently monitored for the following issues for this high-risk pregnancy and has Supervision of normal first teen pregnancy; Maternal obesity affecting pregnancy, antepartum; and Gestational diabetes mellitus (GDM) in third trimester on their problem list.  Patient reports no complaints.  Contractions: Not present. Vag. Bleeding: None.  Movement: Present. Denies leaking of fluid.   The following portions of the patient's history were reviewed and updated as appropriate: allergies, current medications, past family history, past medical history, past social history, past surgical history and problem list. Problem list updated.  Objective:   Vitals:   08/27/21 1344  BP: 136/81  Pulse: 89  Weight: 247 lb (112 kg)    Fetal Status: Fetal Heart Rate (bpm): 145   Movement: Present     General:  Alert, oriented and cooperative. Patient is in no acute distress.  Skin: Skin is warm and dry. No rash noted.   Cardiovascular: Normal heart rate noted  Respiratory: Normal respiratory effort, no problems with respiration noted  Abdomen: Soft, gravid, appropriate for gestational age. Pain/Pressure: Present     Pelvic: Vag. Bleeding: None     Cervical exam deferred        Extremities: Normal range of motion.  Edema: Trace  Mental Status: Normal mood and affect. Normal behavior. Normal judgment and thought content.   Urinalysis:      Assessment and Plan:  Pregnancy: G1P0 at 341w5d1. Supervision of normal first teen pregnancy in third trimester -peds list given  2. Maternal obesity affecting pregnancy, antepartum - Comp Met (CMET) - Protein / creatinine ratio, urine  3. Diet controlled gestational diabetes mellitus (GDM) in third trimester - patient has not yet been seen for GDM education, not scheduled until 09/10/2021, message to front desk to schedule ASAP for education -pt has  been randomly taking glucose measurements throughout the day for past week - one value at 177, most values 125 or under. Discussed timing of blood glucose checks, limiting carbohydrates/sugars, walks after meals, will schedule in one week with MD only to review blood sugar logs -growth USKoreacheduled 09/11/2021  4. [redacted] weeks gestation of pregnancy  Preterm labor symptoms and general obstetric precautions including but not limited to vaginal bleeding, contractions, leaking of fluid and fetal movement were reviewed in detail with the patient. I discussed the assessment and treatment plan with the patient. The patient was provided an opportunity to ask questions and all were answered. The patient agreed with the plan and demonstrated an understanding of the instructions. The patient was advised to call back or seek an in-person office evaluation/[facility] or concerning symptoms. Please refer to After Visit Summary for other counseling recommendations.  Return in about 1 week (around 09/03/2021) for in-person HOB/MD ONLY.   [place] Calia, NiGerrie NordmannNP

## 2021-08-28 LAB — COMPREHENSIVE METABOLIC PANEL
ALT: 13 IU/L (ref 0–32)
AST: 12 IU/L (ref 0–40)
Albumin/Globulin Ratio: 1.3 (ref 1.2–2.2)
Albumin: 3.9 g/dL (ref 3.9–5.0)
Alkaline Phosphatase: 143 IU/L — ABNORMAL HIGH (ref 42–106)
BUN/Creatinine Ratio: 23 (ref 9–23)
BUN: 10 mg/dL (ref 6–20)
Bilirubin Total: <0.2 mg/dL (ref 0.0–1.2)
CO2: 19 mmol/L — ABNORMAL LOW (ref 20–29)
Calcium: 9 mg/dL (ref 8.7–10.2)
Chloride: 106 mmol/L (ref 96–106)
Creatinine, Ser: 0.43 mg/dL — ABNORMAL LOW (ref 0.57–1.00)
Globulin, Total: 2.9 g/dL (ref 1.5–4.5)
Glucose: 100 mg/dL — ABNORMAL HIGH (ref 65–99)
Potassium: 3.9 mmol/L (ref 3.5–5.2)
Sodium: 140 mmol/L (ref 134–144)
Total Protein: 6.8 g/dL (ref 6.0–8.5)
eGFR: 144 mL/min/{1.73_m2} (ref >59)

## 2021-08-28 LAB — PROTEIN / CREATININE RATIO, URINE
Creatinine, Urine: 95.3 mg/dL
Protein, Ur: 12.6 mg/dL
Protein/Creat Ratio: 132 mg/g creat (ref 0–200)

## 2021-09-03 ENCOUNTER — Encounter: Payer: Medicaid Other | Admitting: Obstetrics and Gynecology

## 2021-09-10 ENCOUNTER — Encounter: Payer: Medicaid Other | Attending: Medical | Admitting: Registered"

## 2021-09-10 ENCOUNTER — Encounter: Payer: Self-pay | Admitting: Registered"

## 2021-09-10 ENCOUNTER — Other Ambulatory Visit: Payer: Self-pay

## 2021-09-10 DIAGNOSIS — O24410 Gestational diabetes mellitus in pregnancy, diet controlled: Secondary | ICD-10-CM | POA: Diagnosis not present

## 2021-09-10 DIAGNOSIS — Z3A00 Weeks of gestation of pregnancy not specified: Secondary | ICD-10-CM | POA: Diagnosis not present

## 2021-09-10 DIAGNOSIS — O24419 Gestational diabetes mellitus in pregnancy, unspecified control: Secondary | ICD-10-CM

## 2021-09-10 NOTE — Progress Notes (Signed)
Patient was seen on 09/10/21 for Gestational Diabetes self-management class [facility]. The following learning objectives were met by the patient during this course:  States the definition of Gestational Diabetes States why dietary management is important in controlling blood glucose Describes the effects each nutrient has on blood glucose levels Demonstrates ability to create a balanced meal plan Demonstrates carbohydrate counting  States when to check blood glucose levels Demonstrates proper blood glucose monitoring techniques States the effect of stress and exercise on blood glucose levels States the importance of limiting caffeine and abstaining from alcohol and smoking  Blood glucose monitor given: Patient has meter and is checking blood sugar prior to class   Patient instructed to monitor glucose levels: FBS: 60 - <95; 1 hour: <140; 2 hour: <120  Patient received handouts: Nutrition Diabetes and Pregnancy, including carb counting list  Patient will be seen for follow-up as needed. 

## 2021-09-11 ENCOUNTER — Ambulatory Visit: Payer: Medicaid Other | Admitting: *Deleted

## 2021-09-11 ENCOUNTER — Ambulatory Visit: Payer: Medicaid Other | Attending: Obstetrics and Gynecology

## 2021-09-11 ENCOUNTER — Encounter: Payer: Self-pay | Admitting: *Deleted

## 2021-09-11 VITALS — BP 132/72 | HR 83

## 2021-09-11 DIAGNOSIS — O99210 Obesity complicating pregnancy, unspecified trimester: Secondary | ICD-10-CM | POA: Diagnosis present

## 2021-09-11 DIAGNOSIS — O24419 Gestational diabetes mellitus in pregnancy, unspecified control: Secondary | ICD-10-CM | POA: Insufficient documentation

## 2021-09-11 DIAGNOSIS — Z3403 Encounter for supervision of normal first pregnancy, third trimester: Secondary | ICD-10-CM | POA: Diagnosis present

## 2021-09-11 DIAGNOSIS — E669 Obesity, unspecified: Secondary | ICD-10-CM | POA: Diagnosis not present

## 2021-09-11 DIAGNOSIS — Z3A33 33 weeks gestation of pregnancy: Secondary | ICD-10-CM | POA: Insufficient documentation

## 2021-09-11 DIAGNOSIS — O24410 Gestational diabetes mellitus in pregnancy, diet controlled: Secondary | ICD-10-CM | POA: Diagnosis not present

## 2021-09-11 DIAGNOSIS — O99213 Obesity complicating pregnancy, third trimester: Secondary | ICD-10-CM | POA: Insufficient documentation

## 2021-09-11 DIAGNOSIS — O99212 Obesity complicating pregnancy, second trimester: Secondary | ICD-10-CM | POA: Diagnosis not present

## 2021-09-12 ENCOUNTER — Other Ambulatory Visit: Payer: Self-pay | Admitting: *Deleted

## 2021-09-12 DIAGNOSIS — O24410 Gestational diabetes mellitus in pregnancy, diet controlled: Secondary | ICD-10-CM

## 2021-09-16 ENCOUNTER — Other Ambulatory Visit: Payer: Self-pay

## 2021-09-16 ENCOUNTER — Ambulatory Visit (INDEPENDENT_AMBULATORY_CARE_PROVIDER_SITE_OTHER): Payer: Medicaid Other | Admitting: Obstetrics & Gynecology

## 2021-09-16 VITALS — BP 119/77 | HR 89 | Wt 252.2 lb

## 2021-09-16 DIAGNOSIS — Z3403 Encounter for supervision of normal first pregnancy, third trimester: Secondary | ICD-10-CM

## 2021-09-16 DIAGNOSIS — O99210 Obesity complicating pregnancy, unspecified trimester: Secondary | ICD-10-CM

## 2021-09-16 DIAGNOSIS — O24419 Gestational diabetes mellitus in pregnancy, unspecified control: Secondary | ICD-10-CM

## 2021-09-16 MED ORDER — METFORMIN HCL 500 MG PO TABS: 500.0000 mg | ORAL_TABLET | Freq: Every day | ORAL | 5 refills | Status: DC

## 2021-09-16 NOTE — Progress Notes (Signed)
Pt reports fetal movement, denies pain.  

## 2021-09-16 NOTE — Progress Notes (Signed)
   PRENATAL VISIT NOTE  Subjective:  Kristin Rios is a 20 y.o. G1P0 at [redacted]w[redacted]d being seen today for ongoing prenatal care.  She is currently monitored for the following issues for this high-risk pregnancy and has Supervision of normal first teen pregnancy; Maternal obesity affecting pregnancy, antepartum; and Gestational diabetes mellitus (GDM) in third trimester on their problem list.  Patient reports no complaints.  Contractions: Not present. Vag. Bleeding: None.  Movement: Present. Denies leaking of fluid.   The following portions of the patient's history were reviewed and updated as appropriate: allergies, current medications, past family history, past medical history, past social history, past surgical history and problem list.   Objective:   Vitals:   09/16/21 0901  BP: 119/77  Pulse: 89  Weight: 252 lb 3.2 oz (114.4 kg)    Fetal Status: Fetal Heart Rate (bpm): 149   Movement: Present     General:  Alert, oriented and cooperative. Patient is in no acute distress.  Skin: Skin is warm and dry. No rash noted.   Cardiovascular: Normal heart rate noted  Respiratory: Normal respiratory effort, no problems with respiration noted  Abdomen: Soft, gravid, appropriate for gestational age.  Pain/Pressure: Absent     Pelvic: Cervical exam deferred        Extremities: Normal range of motion.  Edema: Trace  Mental Status: Normal mood and affect. Normal behavior. Normal judgment and thought content.   Assessment and Plan:  Pregnancy: G1P0 at [redacted]w[redacted]d 1. Supervision of normal first teen pregnancy in third trimester Normal fetal growth  2. Gestational diabetes mellitus (GDM) in third trimester, gestational diabetes method of control unspecified FBS 50% > 100 PP upt to 130-160 - metFORMIN (GLUCOPHAGE) 500 MG tablet; Take 1 tablet (500 mg total) by mouth daily with supper.  Dispense: 60 tablet; Refill: 5 - [place] MFM FETAL BPP WO NON STRESS; Future  3. Maternal obesity affecting pregnancy,  antepartum Weekly BPP  Preterm labor symptoms and general obstetric precautions including but not limited to vaginal bleeding, contractions, leaking of fluid and fetal movement were reviewed in detail with the patient. Please refer to After Visit Summary for other counseling recommendations.   Return in about 2 weeks (around 09/30/2021).  Future Appointments  Date Time Provider Department Center  10/09/2021 10:15 AM [facility] NURSE [facility]  10/09/2021 10:30 AM [facility] US3 [facility]    Scheryl Darter, MD

## 2021-09-18 ENCOUNTER — Encounter: Payer: Self-pay | Admitting: *Deleted

## 2021-09-18 ENCOUNTER — Other Ambulatory Visit: Payer: Self-pay

## 2021-09-18 ENCOUNTER — Ambulatory Visit: Payer: Medicaid Other | Admitting: *Deleted

## 2021-09-18 ENCOUNTER — Ambulatory Visit: Payer: Medicaid Other | Attending: Obstetrics & Gynecology

## 2021-09-18 VITALS — BP 127/54 | HR 75

## 2021-09-18 DIAGNOSIS — Z3403 Encounter for supervision of normal first pregnancy, third trimester: Secondary | ICD-10-CM

## 2021-09-18 DIAGNOSIS — O99210 Obesity complicating pregnancy, unspecified trimester: Secondary | ICD-10-CM

## 2021-09-18 DIAGNOSIS — O24419 Gestational diabetes mellitus in pregnancy, unspecified control: Secondary | ICD-10-CM | POA: Diagnosis present

## 2021-09-26 ENCOUNTER — Ambulatory Visit (INDEPENDENT_AMBULATORY_CARE_PROVIDER_SITE_OTHER): Payer: Medicaid Other

## 2021-09-26 ENCOUNTER — Other Ambulatory Visit: Payer: Self-pay

## 2021-09-26 ENCOUNTER — Ambulatory Visit: Payer: Medicaid Other | Admitting: *Deleted

## 2021-09-26 DIAGNOSIS — O24415 Gestational diabetes mellitus in pregnancy, controlled by oral hypoglycemic drugs: Secondary | ICD-10-CM

## 2021-09-29 ENCOUNTER — Other Ambulatory Visit: Payer: Self-pay | Admitting: *Deleted

## 2021-09-29 DIAGNOSIS — O24415 Gestational diabetes mellitus in pregnancy, controlled by oral hypoglycemic drugs: Secondary | ICD-10-CM

## 2021-09-30 ENCOUNTER — Other Ambulatory Visit (HOSPITAL_COMMUNITY)
Admission: RE | Admit: 2021-09-30 | Discharge: 2021-09-30 | Disposition: A | Discharge status: Home / Self Care | Payer: Medicaid Other | Source: Ambulatory Visit | Attending: Obstetrics & Gynecology | Admitting: Obstetrics & Gynecology

## 2021-09-30 ENCOUNTER — Other Ambulatory Visit: Payer: Self-pay

## 2021-09-30 ENCOUNTER — Ambulatory Visit (INDEPENDENT_AMBULATORY_CARE_PROVIDER_SITE_OTHER): Payer: Medicaid Other | Admitting: Obstetrics & Gynecology

## 2021-09-30 VITALS — BP 135/79 | HR 93 | Wt 256.2 lb

## 2021-09-30 DIAGNOSIS — O99210 Obesity complicating pregnancy, unspecified trimester: Secondary | ICD-10-CM

## 2021-09-30 DIAGNOSIS — O24415 Gestational diabetes mellitus in pregnancy, controlled by oral hypoglycemic drugs: Secondary | ICD-10-CM

## 2021-09-30 DIAGNOSIS — Z3403 Encounter for supervision of normal first pregnancy, third trimester: Secondary | ICD-10-CM | POA: Insufficient documentation

## 2021-09-30 DIAGNOSIS — O169 Unspecified maternal hypertension, unspecified trimester: Secondary | ICD-10-CM

## 2021-09-30 LAB — OB RESULTS CONSOLE GC/CHLAMYDIA: Gonorrhea: NEGATIVE

## 2021-09-30 NOTE — Progress Notes (Signed)
Patient presents for ROB and GTT. Patient denies having any headaches, visual changes, or epigastric pain. Patient has no concerns today

## 2021-09-30 NOTE — Progress Notes (Signed)
   PRENATAL VISIT NOTE  Subjective:  Kristin Rios is a 20 y.o. G1P0 at [redacted]w[redacted]d being seen today for ongoing prenatal care.  She is currently monitored for the following issues for this high-risk pregnancy and has Supervision of normal first teen pregnancy; Maternal obesity affecting pregnancy, antepartum; and Gestational diabetes mellitus (GDM) in third trimester on their problem list.  Patient reports occasional contractions.  Contractions: Not present. Vag. Bleeding: None.  Movement: Present. Denies leaking of fluid.   The following portions of the patient's history were reviewed and updated as appropriate: allergies, current medications, past family history, past medical history, past social history, past surgical history and problem list.   Objective:   Vitals:   09/30/21 0957 09/30/21 1000  BP: (!) 142/82 135/79  Pulse: 88 93  Weight: 256 lb 3.2 oz (116.2 kg)     Fetal Status: Fetal Heart Rate (bpm): 145   Movement: Present     General:  Alert, oriented and cooperative. Patient is in no acute distress.  Skin: Skin is warm and dry. No rash noted.   Cardiovascular: Normal heart rate noted  Respiratory: Normal respiratory effort, no problems with respiration noted  Abdomen: Soft, gravid, appropriate for gestational age.  Pain/Pressure: Present     Pelvic: Cervical exam performed in the presence of a chaperone        Extremities: Normal range of motion.  Edema: Trace  Mental Status: Normal mood and affect. Normal behavior. Normal judgment and thought content.   Assessment and Plan  Pregnancy: G1P0 at [redacted]w[redacted]d 1. Supervision of normal first teen pregnancy in third trimester routine - Culture, beta strep (group b only) - Cervicovaginal ancillary only( [place])  2. Gestational diabetes mellitus (GDM) in third trimester controlled on oral hypoglycemic drug Good control values reviewed  3. Maternal obesity affecting pregnancy, antepartum Body mass index is 42.63  kg/m.   4. Hypertension during pregnancy, antepartum, unspecified hypertension in pregnancy type.  Normal labs 9/21, elevated BP was noted at 1st trimester visit  Preterm labor symptoms and general obstetric precautions including but not limited to vaginal bleeding, contractions, leaking of fluid and fetal movement were reviewed in detail with the patient. Please refer to After Visit Summary for other counseling recommendations.   Return in about 1 week (around 10/07/2021).  Future Appointments  Date Time Provider Department Center  10/02/2021  3:15 PM [facility] NST [facility]  10/07/2021  3:50 PM Hermina Staggers, MD [facility] None  10/09/2021 10:15 AM [facility] NURSE [facility]  10/09/2021 10:30 AM [facility] US3 [facility]  10/16/2021  8:15 AM [facility] NST [facility]    Scheryl Darter, MD

## 2021-10-01 LAB — CERVICOVAGINAL ANCILLARY ONLY
Chlamydia: NEGATIVE
Comment: NEGATIVE
Comment: NEGATIVE
Comment: NORMAL
Neisseria Gonorrhea: NEGATIVE
Trichomonas: NEGATIVE

## 2021-10-02 ENCOUNTER — Ambulatory Visit: Payer: Medicaid Other | Admitting: *Deleted

## 2021-10-02 ENCOUNTER — Other Ambulatory Visit: Payer: Self-pay

## 2021-10-02 ENCOUNTER — Ambulatory Visit (INDEPENDENT_AMBULATORY_CARE_PROVIDER_SITE_OTHER): Payer: Medicaid Other

## 2021-10-02 VITALS — BP 119/68 | HR 81

## 2021-10-02 DIAGNOSIS — O24415 Gestational diabetes mellitus in pregnancy, controlled by oral hypoglycemic drugs: Secondary | ICD-10-CM

## 2021-10-04 LAB — CULTURE, BETA STREP (GROUP B ONLY): Strep Gp B Culture: NEGATIVE

## 2021-10-07 ENCOUNTER — Ambulatory Visit (INDEPENDENT_AMBULATORY_CARE_PROVIDER_SITE_OTHER): Payer: Medicaid Other | Admitting: Obstetrics and Gynecology

## 2021-10-07 ENCOUNTER — Other Ambulatory Visit: Payer: Self-pay

## 2021-10-07 ENCOUNTER — Encounter: Payer: Self-pay | Admitting: Obstetrics and Gynecology

## 2021-10-07 VITALS — BP 134/85 | HR 93 | Wt 259.0 lb

## 2021-10-07 DIAGNOSIS — Z3403 Encounter for supervision of normal first pregnancy, third trimester: Secondary | ICD-10-CM

## 2021-10-07 DIAGNOSIS — O24415 Gestational diabetes mellitus in pregnancy, controlled by oral hypoglycemic drugs: Secondary | ICD-10-CM

## 2021-10-07 NOTE — Progress Notes (Signed)
Subjective:  Kristin Rios is a 20 y.o. G1P0 at [redacted]w[redacted]d being seen today for ongoing prenatal care.  She is currently monitored for the following issues for this high-risk pregnancy and has Supervision of normal first teen pregnancy; Maternal obesity affecting pregnancy, antepartum; and Gestational diabetes mellitus (GDM) in third trimester on their problem list.  Patient reports general discomforts of pregnancy.  Contractions: Irregular. Vag. Bleeding: None.  Movement: Present. Denies leaking of fluid.   The following portions of the patient's history were reviewed and updated as appropriate: allergies, current medications, past family history, past medical history, past social history, past surgical history and problem list. Problem list updated.  Objective:   Vitals:   10/07/21 1548  BP: 134/85  Pulse: 93  Weight: 259 lb (117.5 kg)    Fetal Status: Fetal Heart Rate (bpm): 155   Movement: Present     General:  Alert, oriented and cooperative. Patient is in no acute distress.  Skin: Skin is warm and dry. No rash noted.   Cardiovascular: Normal heart rate noted  Respiratory: Normal respiratory effort, no problems with respiration noted  Abdomen: Soft, gravid, appropriate for gestational age. Pain/Pressure: Present     Pelvic:  Cervical exam deferred        Extremities: Normal range of motion.  Edema: Trace  Mental Status: Normal mood and affect. Normal behavior. Normal judgment and thought content.   Urinalysis:      Assessment and Plan:  Pregnancy: G1P0 at [redacted]w[redacted]d  1. Supervision of normal first teen pregnancy in third trimester Labor precautions   2. Gestational diabetes mellitus (GDM) in third trimester controlled on oral hypoglycemic drug CBG's in goal range. Continue with Metformin BPP and growth on 10/10/21 IOL scheduled for 39-39 6/7 weeks  Term labor symptoms and general obstetric precautions including but not limited to vaginal bleeding, contractions, leaking of  fluid and fetal movement were reviewed in detail with the patient. Please refer to After Visit Summary for other counseling recommendations.  Return in about 1 week (around 10/14/2021) for OB visit, face to face, MD only.   Hermina Staggers, MD

## 2021-10-07 NOTE — Patient Instructions (Addendum)
Vaginal Delivery Vaginal delivery means that you give birth by pushing your baby out of your birth canal (vagina). Your health care team will help you before, during, and after vaginal delivery. Birth experiences are unique for every woman and every pregnancy, and birth experiences vary depending on where you choose to give birth. What are the risks and benefits? Generally, this is safe. However, problems may occur, including: Bleeding. Infection. Damage to other structures such as vaginal tearing. Allergic reactions to medicines. Despite the risks, benefits of vaginal delivery include less risk of bleeding and infection and a shorter recovery time compared to a Cesarean delivery. Cesarean delivery, or C-section, is the surgical delivery of a baby. What happens when I arrive at the birth center or hospital? Once you are in labor and have been admitted into the hospital or birth center, your health care team may: Review your pregnancy history and any concerns that you have. Talk with you about your birth plan and discuss pain control options. Check your blood pressure, breathing, and heartbeat. Assess your baby's heartbeat. Monitor your uterus for contractions. Check whether your bag of water (amniotic sac) has broken (ruptured). Insert an IV into one of your veins. This may be used to give you fluids and medicines. Monitoring Your health care team may assess your contractions (uterine monitoring) and your baby's heart rate (fetal monitoring). You may need to be monitored: Often, but not continuously (intermittently). All the time or for long periods at a time (continuously). Continuous monitoring may be needed if: You are taking certain medicines, such as medicine to relieve pain or make your contractions stronger. You have pregnancy or labor complications. Monitoring may be done by: Placing a special stethoscope or a handheld monitoring device on your abdomen to check your baby's heartbeat  and to check for contractions. Placing monitors on your abdomen (external monitors) to record your baby's heartbeat and the frequency and length of contractions. Placing monitors inside your uterus through your vagina (internal monitors) to record your baby's heartbeat and the frequency, length, and strength of your contractions. Depending on the type of monitor, it may remain in your uterus or on your baby's head until birth. Telemetry. This is a type of continuous monitoring that can be done with external or internal monitors. Instead of having to stay in bed, you are able to move around. Physical exam Your health care team may perform frequent physical exams. This may include: Checking how and where your baby is positioned in your uterus. Checking your cervix to determine: Whether it is thinning out (effacing). Whether it is opening up (dilating). What happens during labor and delivery? Normal labor and delivery is divided into the following three stages: Stage 1 This is the longest stage of labor. Throughout this stage, you will feel contractions. Contractions generally feel mild, infrequent, and irregular at first. They get stronger, more frequent, and more regular as you move through this stage. You may have contractions about every 2-3 minutes. This stage ends when your cervix is completely dilated to 4 inches (10 cm) and completely effaced. Stage 2 This stage starts once your cervix is completely effaced and dilated and lasts until the delivery of your baby. This is the stage where you will feel an urge to push your baby out of your vagina. You may feel stretching and burning pain, especially when the widest part of your baby's head passes through the vaginal opening (crowning). Once your baby is delivered, the umbilical cord will be clamped and  cut. Timing of cutting the cord will depend on your wishes, your baby's health, and your health care provider's practices. Your baby will be  placed on your bare chest (skin-to-skin contact) in an upright position and covered with a warm blanket. If you are choosing to breastfeed, watch your baby for feeding cues, like rooting or sucking, and help the baby to your breast for his or her first feeding. Stage 3 This stage starts immediately after the birth of your baby and ends after you deliver the placenta. This stage may take anywhere from 5 to 30 minutes. After your baby has been delivered, you will feel contractions as your body expels the placenta. These contractions also help your uterus get smaller and reduce bleeding. What can I expect after labor and delivery? After labor is over, you and your baby will be assessed closely until you are ready to go home. Your health care team will teach you how to care for yourself and your baby. You and your baby may be encouraged to stay in the same room (rooming in) during your hospital stay. This will help promote early bonding and successful breastfeeding. Your uterus will be checked and massaged regularly (fundal massage). You may continue to receive fluids and medicines through an IV. You will have some soreness and pain in your abdomen, vagina, and the area of skin between your vaginal opening and your anus (perineum). If an incision was made near your vagina (episiotomy) or if you had some vaginal tearing during delivery, cold compresses may be placed on your episiotomy or your tear. This helps to reduce pain and swelling. It is normal to have vaginal bleeding after delivery. Wear a sanitary pad for vaginal bleeding and discharge. Summary Vaginal delivery means that you will give birth by pushing your baby out of your birth canal (vagina). Your health care team will monitor you and your baby throughout the stages of labor. After you deliver your baby, your health care team will continue to assess you and your baby to ensure you are both recovering as expected after delivery. This  information is not intended to replace advice given to you by your health care provider. Make sure you discuss any questions you have with your health care provider. Document Revised: 10/21/2020 Document Reviewed: 10/21/2020 Elsevier Patient Education  2022 [employer]. Third Trimester of Pregnancy The third trimester of pregnancy is from week 28 through week 40. This is months 7 through 9. The third trimester is a time when the unborn baby (fetus) is growing rapidly. At the end of the ninth month, the fetus is about 20 inches long and weighs 6-10 pounds. Body changes during your third trimester During the third trimester, your body will continue to go through many changes. The changes vary and generally return to normal after your baby is born. Physical changes Your weight will continue to increase. You can expect to gain 25-35 pounds (11-16 kg) by the end of the pregnancy if you begin pregnancy at a normal weight. If you are underweight, you can expect to gain 28-40 lb (about 13-18 kg), and if you are overweight, you can expect to gain 15-25 lb (about 7-11 kg). You may begin to get stretch marks on your hips, abdomen, and breasts. Your breasts will continue to grow and may hurt. A yellow fluid (colostrum) may leak from your breasts. This is the first milk you are producing for your baby. You may have changes in your hair. These can include thickening of  your hair, rapid growth, and changes in texture. Some people also have hair loss during or after pregnancy, or hair that feels dry or thin. °Your belly button may stick out. °You may notice more swelling in your hands, face, or ankles. °Health changes °You may have heartburn. °You may have constipation. °You may develop hemorrhoids. °You may develop swollen, bulging veins (varicose veins) in your legs. °You may have increased body aches in the pelvis, back, or thighs. This is due to weight gain and increased hormones that are relaxing your joints. °You  may have increased tingling or numbness in your hands, arms, and legs. The skin on your abdomen may also feel numb. °You may feel short of breath because of your expanding uterus. °Other changes °You may urinate more often because the fetus is moving lower into your pelvis and pressing on your bladder. °You may have more problems sleeping. This may be caused by the size of your abdomen, an increased need to urinate, and an increase in your body's metabolism. °You may notice the fetus "dropping," or moving lower in your abdomen (lightening). °You may have increased vaginal discharge. °You may notice that you have pain around your pelvic bone as your uterus distends. °Follow these instructions at home: °Medicines °Follow your health care provider's instructions regarding medicine use. Specific medicines may be either safe or unsafe to take during pregnancy. Do not take any medicines unless approved by your health care provider. °Take a prenatal vitamin that contains at least 600 micrograms (mcg) of folic acid. °Eating and drinking °Eat a healthy diet that includes fresh fruits and vegetables, whole grains, good sources of protein such as meat, eggs, or tofu, and low-fat dairy products. °Avoid raw meat and unpasteurized juice, milk, and cheese. These carry germs that can harm you and your baby. °Eat 4 or 5 small meals rather than 3 large meals a day. °You may need to take these actions to prevent or treat constipation: °Drink enough fluid to keep your urine pale yellow. °Eat foods that are high in fiber, such as beans, whole grains, and fresh fruits and vegetables. °Limit foods that are high in fat and processed sugars, such as fried or sweet foods. °Activity °Exercise only as directed by your health care provider. Most people can continue their usual exercise routine during pregnancy. Try to exercise for 30 minutes at least 5 days a week. Stop exercising if you experience contractions in the uterus. °Stop exercising  if you develop pain or cramping in the lower abdomen or lower back. °Avoid heavy lifting. °Do not exercise if it is very hot or humid or if you are at a high altitude. °If you choose to, you may continue to have sex unless your health care provider tells you not to. °Relieving pain and discomfort °Take frequent breaks and rest with your legs raised (elevated) if you have leg cramps or low back pain. °Take warm sitz baths to soothe any pain or discomfort caused by hemorrhoids. Use hemorrhoid cream if your health care provider approves. °Wear a supportive bra to prevent discomfort from breast tenderness. °If you develop varicose veins: °Wear support hose as told by your health care provider. °Elevate your feet for 15 minutes, 3-4 times a day. °Limit salt in your diet. °Safety °Talk to your health care provider before traveling far distances. °Do not use hot tubs, steam rooms, or saunas. °Wear your seat belt at all times when driving or riding in a car. °Talk with your health care provider   if someone is verbally or physically abusive to you. °Preparing for birth °To prepare for the arrival of your baby: °Take prenatal classes to understand, practice, and ask questions about labor and delivery. °Visit the hospital and tour the maternity area. °Purchase a rear-facing car seat and make sure you know how to install it in your car. °Prepare the baby's room or sleeping area. Make sure to remove all pillows and stuffed animals from the baby's crib to prevent suffocation. °General instructions °Avoid cat litter boxes and soil used by cats. These carry germs that can cause birth defects in the baby. If you have a cat, ask someone to clean the litter box for you. °Do not douche or use tampons. Do not use scented sanitary pads. °Do not use any products that contain nicotine or tobacco, such as cigarettes, e-cigarettes, and chewing tobacco. If you need help quitting, ask your health care provider. °Do not use any herbal remedies,  illegal drugs, or medicines that were not prescribed to you. Chemicals in these products can harm your baby. °Do not drink alcohol. °You will have more frequent prenatal exams during the third trimester. During a routine prenatal visit, your health care provider will do a physical exam, perform tests, and discuss your overall health. Keep all follow-up visits. This is important. °Where to find more information °American Pregnancy Association: americanpregnancy.org °American College of Obstetricians and Gynecologists: acog.org/en/Womens%20Health/Pregnancy °Office on Women's Health: womenshealth.gov/pregnancy °Contact a health care provider if you have: °A fever. °Mild pelvic cramps, pelvic pressure, or nagging pain in your abdominal area or lower back. °Vomiting or diarrhea. °Bad-smelling vaginal discharge or foul-smelling urine. °Pain when you urinate. °A headache that does not go away when you take medicine. °Visual changes or see spots in front of your eyes. °Get help right away if: °Your water breaks. °You have regular contractions less than 5 minutes apart. °You have spotting or bleeding from your vagina. °You have severe abdominal pain. °You have difficulty breathing. °You have chest pain. °You have fainting spells. °You have not felt your baby move for the time period told by your health care provider. °You have new or increased pain, swelling, or redness in an arm or leg. °Summary °The third trimester of pregnancy is from week 28 through week 40 (months 7 through 9). °You may have more problems sleeping. This can be caused by the size of your abdomen, an increased need to urinate, and an increase in your body's metabolism. °You will have more frequent prenatal exams during the third trimester. Keep all follow-up visits. This is important. °This information is not intended to replace advice given to you by your health care provider. Make sure you discuss any questions you have with your health care  provider. °Document Revised: 05/01/2020 Document Reviewed: 03/07/2020 °Elsevier Patient Education © 2022 [employer]vier Inc[employer] ° °

## 2021-10-08 ENCOUNTER — Other Ambulatory Visit: Payer: Self-pay | Admitting: Advanced Practice Midwife

## 2021-10-09 ENCOUNTER — Ambulatory Visit: Payer: Medicaid Other | Attending: Obstetrics and Gynecology

## 2021-10-09 ENCOUNTER — Ambulatory Visit: Payer: Medicaid Other | Admitting: *Deleted

## 2021-10-09 ENCOUNTER — Encounter: Payer: Self-pay | Admitting: *Deleted

## 2021-10-09 ENCOUNTER — Other Ambulatory Visit: Payer: Self-pay

## 2021-10-09 ENCOUNTER — Other Ambulatory Visit: Payer: Medicaid Other

## 2021-10-09 VITALS — BP 140/78 | HR 88

## 2021-10-09 DIAGNOSIS — Z3A37 37 weeks gestation of pregnancy: Secondary | ICD-10-CM | POA: Diagnosis not present

## 2021-10-09 DIAGNOSIS — E669 Obesity, unspecified: Secondary | ICD-10-CM

## 2021-10-09 DIAGNOSIS — Z3403 Encounter for supervision of normal first pregnancy, third trimester: Secondary | ICD-10-CM | POA: Diagnosis present

## 2021-10-09 DIAGNOSIS — O24410 Gestational diabetes mellitus in pregnancy, diet controlled: Secondary | ICD-10-CM | POA: Insufficient documentation

## 2021-10-09 DIAGNOSIS — O99210 Obesity complicating pregnancy, unspecified trimester: Secondary | ICD-10-CM

## 2021-10-09 DIAGNOSIS — O99213 Obesity complicating pregnancy, third trimester: Secondary | ICD-10-CM | POA: Diagnosis not present

## 2021-10-09 DIAGNOSIS — O24415 Gestational diabetes mellitus in pregnancy, controlled by oral hypoglycemic drugs: Secondary | ICD-10-CM

## 2021-10-13 ENCOUNTER — Encounter (HOSPITAL_COMMUNITY): Payer: Self-pay | Admitting: Obstetrics and Gynecology

## 2021-10-13 ENCOUNTER — Ambulatory Visit (HOSPITAL_COMMUNITY)
Admission: AD | Admit: 2021-10-13 | Discharge: 2021-10-13 | Disposition: A | Discharge status: Home / Self Care | Payer: Medicaid Other | Attending: Family Medicine | Admitting: Family Medicine

## 2021-10-13 DIAGNOSIS — O24415 Gestational diabetes mellitus in pregnancy, controlled by oral hypoglycemic drugs: Secondary | ICD-10-CM | POA: Diagnosis not present

## 2021-10-13 DIAGNOSIS — O99413 Diseases of the circulatory system complicating pregnancy, third trimester: Secondary | ICD-10-CM | POA: Diagnosis not present

## 2021-10-13 DIAGNOSIS — Z3A38 38 weeks gestation of pregnancy: Secondary | ICD-10-CM | POA: Diagnosis not present

## 2021-10-13 DIAGNOSIS — Z3403 Encounter for supervision of normal first pregnancy, third trimester: Secondary | ICD-10-CM

## 2021-10-13 DIAGNOSIS — R030 Elevated blood-pressure reading, without diagnosis of hypertension: Secondary | ICD-10-CM | POA: Diagnosis not present

## 2021-10-13 DIAGNOSIS — O26893 Other specified pregnancy related conditions, third trimester: Secondary | ICD-10-CM | POA: Diagnosis not present

## 2021-10-13 DIAGNOSIS — O99213 Obesity complicating pregnancy, third trimester: Secondary | ICD-10-CM

## 2021-10-13 DIAGNOSIS — E669 Obesity, unspecified: Secondary | ICD-10-CM | POA: Diagnosis not present

## 2021-10-13 DIAGNOSIS — I10 Essential (primary) hypertension: Secondary | ICD-10-CM | POA: Diagnosis present

## 2021-10-13 DIAGNOSIS — O99210 Obesity complicating pregnancy, unspecified trimester: Secondary | ICD-10-CM

## 2021-10-13 LAB — PROTEIN / CREATININE RATIO, URINE
Creatinine, Urine: 224.88 mg/dL
Protein Creatinine Ratio: 0.41 mg/mg{Cre} — ABNORMAL HIGH (ref 0.00–0.15)
Total Protein, Urine: 93 mg/dL

## 2021-10-13 LAB — CBC
HCT: 37.5 % (ref 36.0–46.0)
Hemoglobin: 12.4 g/dL (ref 12.0–15.0)
MCH: 27.9 pg (ref 26.0–34.0)
MCHC: 33.1 g/dL (ref 30.0–36.0)
MCV: 84.5 fL (ref 80.0–100.0)
Platelets: 259 10*3/uL (ref 150–400)
RBC: 4.44 MIL/uL (ref 3.87–5.11)
RDW: 14.9 % (ref 11.5–15.5)
WBC: 9.5 10*3/uL (ref 4.0–10.5)
nRBC: 0 % (ref 0.0–0.2)

## 2021-10-13 LAB — URINALYSIS, ROUTINE W REFLEX MICROSCOPIC
Bilirubin Urine: NEGATIVE
Glucose, UA: NEGATIVE mg/dL
Hgb urine dipstick: NEGATIVE
Ketones, ur: NEGATIVE mg/dL
Leukocytes,Ua: NEGATIVE
Nitrite: NEGATIVE
Protein, ur: 100 mg/dL — AB
Specific Gravity, Urine: 1.028 (ref 1.005–1.030)
pH: 6 (ref 5.0–8.0)

## 2021-10-13 LAB — COMPREHENSIVE METABOLIC PANEL
ALT: 18 U/L (ref 0–44)
AST: 20 U/L (ref 15–41)
Albumin: 2.7 g/dL — ABNORMAL LOW (ref 3.5–5.0)
Alkaline Phosphatase: 170 U/L — ABNORMAL HIGH (ref 38–126)
Anion gap: 8 (ref 5–15)
BUN: 12 mg/dL (ref 6–20)
CO2: 20 mmol/L — ABNORMAL LOW (ref 22–32)
Calcium: 9.2 mg/dL (ref 8.9–10.3)
Chloride: 108 mmol/L (ref 98–111)
Creatinine, Ser: 0.53 mg/dL (ref 0.44–1.00)
GFR, Estimated: >60 mL/min (ref >60)
Glucose, Bld: 90 mg/dL (ref 70–99)
Potassium: 4 mmol/L (ref 3.5–5.1)
Sodium: 136 mmol/L (ref 135–145)
Total Bilirubin: 0.5 mg/dL (ref 0.3–1.2)
Total Protein: 6.6 g/dL (ref 6.5–8.1)

## 2021-10-13 NOTE — [facility] Note (Signed)
Called lab to notify them that a Protein/Creatinine Ratio has been added on. Lab stated they would run it now.

## 2021-10-13 NOTE — [facility]U Note (Signed)
.  Kristin Rios is a 20 y.o. at [redacted]w[redacted]d here in [facility]U reporting: took bp this morning it was 186/116, rechecked before she came in and it was 130s/90s. Denies HA, blurry vision, or epigastric pain. Endorses good fetal movement.   Pain score: 0   FHT:157 Lab orders placed from triage:  UA

## 2021-10-13 NOTE — MAU Provider Note (Signed)
History     CSN: [id number]  Arrival date and time: 10/13/21 1140   Event Date/Time   First Provider Initiated Contact with Patient 10/13/21 1216      Chief Complaint  Patient presents with   Hypertension   HPI This is a 20 year old G1 at 27 weeks and 3 days with pregnancy complicated by GDM on metformin.  Patient has been having some blood pressures in the 140s over 90 range at home.  Mild intermittent headache, which she currently does not have.  No clear provoking factors.  Denies scotomata, decreased fetal movement, contractions, bleeding, leaking fluid.  OB History     Gravida  1   Para      Term      Preterm      AB      Living  0      SAB      IAB      Ectopic      Multiple      Live Births  0           Past Medical History:  Diagnosis Date   Medical history non-contributory     Past Surgical History:  Procedure Laterality Date   WISDOM TOOTH EXTRACTION      Family History  Problem Relation Age of Onset   Healthy Mother     Social History   Tobacco Use   Smoking status: Never   Smokeless tobacco: Never  Vaping Use   Vaping Use: Never used  Substance Use Topics   Alcohol use: Not Currently   Drug use: Not Currently    Allergies: No Known Allergies  Medications Prior to Admission  Medication Sig Dispense Refill Last Dose   aspirin EC 81 MG tablet Take 1 tablet (81 mg total) by mouth daily. 60 tablet 2 10/13/2021   Prenatal Vit-Fe Fumarate-FA (PREPLUS) 27-1 MG TABS Take 1 tablet by mouth daily. 30 tablet 13 10/13/2021   Accu-Chek Softclix Lancets lancets Use 1 lancet each to check blood glucose 4 times daily 100 each 5    Blood Glucose Monitoring Suppl (ACCU-CHEK GUIDE) w/Device KIT Use meter to check blood glucose 4 times daily 1 kit 0    Blood Pressure Monitoring (BLOOD PRESSURE KIT) DEVI 1 kit by Does not apply route once a week. 1 each 0    glucose blood (ACCU-CHEK GUIDE) test strip Use 1 test strip each to check blood glucose  4 times daily 100 each 5    metFORMIN (GLUCOPHAGE) 500 MG tablet Take 1 tablet (500 mg total) by mouth daily with supper. 60 tablet 5     Review of Systems Physical Exam   Blood pressure 130/82, pulse 93, temperature 98.1 F (36.7 C), temperature source Oral, resp. rate 15, weight 119.2 kg, last menstrual period 01/25/2021, SpO2 98 %.  Physical Exam Vitals and nursing note reviewed.  Constitutional:      Appearance: Normal appearance.  HENT:     Head: Normocephalic and atraumatic.  Cardiovascular:     Rate and Rhythm: Normal rate and regular rhythm.     Pulses: Normal pulses.     Heart sounds: Normal heart sounds.  Pulmonary:     Effort: Pulmonary effort is normal.     Breath sounds: Normal breath sounds.  Abdominal:     General: Abdomen is flat. There is no distension.     Palpations: Abdomen is soft.     Tenderness: There is no abdominal tenderness. There is no guarding or rebound.  Skin:    General: Skin is warm and dry.     Capillary Refill: Capillary refill takes less than 2 seconds.  Neurological:     General: No focal deficit present.     Mental Status: She is alert.  Psychiatric:        Mood and Affect: Mood normal.        Behavior: Behavior normal.        Thought Content: Thought content normal.        Judgment: Judgment normal.   Results for orders placed or performed during the hospital encounter of 10/13/21 (from the past 24 hour(s))  Urinalysis, Routine w reflex microscopic Urine, Clean Catch     Status: Abnormal   Collection Time: 10/13/21 12:02 PM  Result Value Ref Range   Color, Urine AMBER (A) YELLOW   APPearance HAZY (A) CLEAR   Specific Gravity, Urine 1.028 1.005 - 1.030   pH 6.0 5.0 - 8.0   Glucose, UA NEGATIVE NEGATIVE mg/dL   Hgb urine dipstick NEGATIVE NEGATIVE   Bilirubin Urine NEGATIVE NEGATIVE   Ketones, ur NEGATIVE NEGATIVE mg/dL   Protein, ur 100 (A) NEGATIVE mg/dL   Nitrite NEGATIVE NEGATIVE   Leukocytes,Ua NEGATIVE NEGATIVE   RBC /  HPF 0-5 0 - 5 RBC/hpf   WBC, UA 0-5 0 - 5 WBC/hpf   Bacteria, UA RARE (A) NONE SEEN   Squamous Epithelial / LPF 0-5 0 - 5   Mucus PRESENT   Protein / creatinine ratio, urine     Status: Abnormal   Collection Time: 10/13/21 12:14 PM  Result Value Ref Range   Creatinine, Urine 224.88 mg/dL   Total Protein, Urine 93 mg/dL   Protein Creatinine Ratio 0.41 (H) 0.00 - 0.15 mg/mg[Cre]  Comprehensive metabolic panel     Status: Abnormal   Collection Time: 10/13/21 12:17 PM  Result Value Ref Range   Sodium 136 135 - 145 mmol/L   Potassium 4.0 3.5 - 5.1 mmol/L   Chloride 108 98 - 111 mmol/L   CO2 20 (L) 22 - 32 mmol/L   Glucose, Bld 90 70 - 99 mg/dL   BUN 12 6 - 20 mg/dL   Creatinine, Ser 0.53 0.44 - 1.00 mg/dL   Calcium 9.2 8.9 - 10.3 mg/dL   Total Protein 6.6 6.5 - 8.1 g/dL   Albumin 2.7 (L) 3.5 - 5.0 g/dL   AST 20 15 - 41 U/L   ALT 18 0 - 44 U/L   Alkaline Phosphatase 170 (H) 38 - 126 U/L   Total Bilirubin 0.5 0.3 - 1.2 mg/dL   GFR, Estimated >60 >60 mL/min   Anion gap 8 5 - 15  CBC     Status: None   Collection Time: 10/13/21 12:17 PM  Result Value Ref Range   WBC 9.5 4.0 - 10.5 K/uL   RBC 4.44 3.87 - 5.11 MIL/uL   Hemoglobin 12.4 12.0 - 15.0 g/dL   HCT 37.5 36.0 - 46.0 %   MCV 84.5 80.0 - 100.0 fL   MCH 27.9 26.0 - 34.0 pg   MCHC 33.1 30.0 - 36.0 g/dL   RDW 14.9 11.5 - 15.5 %   Platelets 259 150 - 400 K/uL   nRBC 0.0 0.0 - 0.2 %     MAU Course  Procedures NST:  Baseline: 140  Variability: mod Accelerations: ++  Decelerations: non Contractions: none   MDM   Assessment and Plan   1. Maternal obesity affecting pregnancy, antepartum   2. Supervision of  normal first teen pregnancy in third trimester   3. Gestational diabetes mellitus (GDM) in third trimester controlled on oral hypoglycemic drug   4. [redacted] weeks gestation of pregnancy   5. Elevated BP without diagnosis of hypertension    BP remained normal Return precautions given. Labs normal Discharge to  home. Induction Friday due to A2 GDM.  Truett Mainland 10/13/2021, 12:36 PM

## 2021-10-13 NOTE — MAU Note (Signed)
Spoke with Terrance in [employer] and informed him the patients Protein/Creatinine Ratio was still not processing. He stated he would search for the sample now and run it.

## 2021-10-14 ENCOUNTER — Encounter: Payer: Self-pay | Admitting: Obstetrics and Gynecology

## 2021-10-14 ENCOUNTER — Other Ambulatory Visit: Payer: Self-pay

## 2021-10-14 ENCOUNTER — Ambulatory Visit (INDEPENDENT_AMBULATORY_CARE_PROVIDER_SITE_OTHER): Payer: Medicaid Other | Admitting: Obstetrics and Gynecology

## 2021-10-14 VITALS — BP 129/81 | HR 82 | Wt 262.5 lb

## 2021-10-14 DIAGNOSIS — O24415 Gestational diabetes mellitus in pregnancy, controlled by oral hypoglycemic drugs: Secondary | ICD-10-CM

## 2021-10-14 DIAGNOSIS — Z3403 Encounter for supervision of normal first pregnancy, third trimester: Secondary | ICD-10-CM

## 2021-10-14 DIAGNOSIS — O99210 Obesity complicating pregnancy, unspecified trimester: Secondary | ICD-10-CM

## 2021-10-14 NOTE — Progress Notes (Signed)
Patient is here for 1 week follow up.

## 2021-10-14 NOTE — Progress Notes (Signed)
   PRENATAL VISIT NOTE  Subjective:  Kristin Rios is a 20 y.o. G1P0 at [redacted]w[redacted]d being seen today for ongoing prenatal care.  She is currently monitored for the following issues for this high-risk pregnancy and has Supervision of normal first teen pregnancy; Maternal obesity affecting pregnancy, antepartum; and Gestational diabetes mellitus (GDM) in third trimester on their problem list.  Patient reports no complaints.  Contractions: Not present.  .  Movement: Present. Denies leaking of fluid.   The following portions of the patient's history were reviewed and updated as appropriate: allergies, current medications, past family history, past medical history, past social history, past surgical history and problem list.   Objective:   Vitals:   10/14/21 1501  BP: 129/81  Pulse: 82  Weight: 262 lb 8 oz (119.1 kg)    Fetal Status: Fetal Heart Rate (bpm): 152 Fundal Height: 40 cm Movement: Present     General:  Alert, oriented and cooperative. Patient is in no acute distress.  Skin: Skin is warm and dry. No rash noted.   Cardiovascular: Normal heart rate noted  Respiratory: Normal respiratory effort, no problems with respiration noted  Abdomen: Soft, gravid, appropriate for gestational age.  Pain/Pressure: Present     Pelvic: Cervical exam performed in the presence of a chaperone Dilation: Closed Effacement (%): Thick Station: -3  Extremities: Normal range of motion.  Edema: Trace  Mental Status: Normal mood and affect. Normal behavior. Normal judgment and thought content.   Assessment and Plan:  Pregnancy: G1P0 at [redacted]w[redacted]d 1. Supervision of normal first teen pregnancy in third trimester PAtient is doing well without complaints Answered questions regarding IOL  2. Gestational diabetes mellitus (GDM) in third trimester controlled on oral hypoglycemic drug CBGs reviewed and all values within range on metformin Scheduled for IOL on 11/11 Follow up BPP on 11/10  3. Maternal obesity  affecting pregnancy, antepartum   Term labor symptoms and general obstetric precautions including but not limited to vaginal bleeding, contractions, leaking of fluid and fetal movement were reviewed in detail with the patient. Please refer to After Visit Summary for other counseling recommendations.   Return in about 6 weeks (around 11/25/2021) for postpartum.  Future Appointments  Date Time Provider Department Center  10/16/2021  8:15 AM [facility]  10/17/2021  7:15 AM [facility] SCHED ROOM [facility] None    Catalina Antigua, MD

## 2021-10-16 ENCOUNTER — Ambulatory Visit (INDEPENDENT_AMBULATORY_CARE_PROVIDER_SITE_OTHER): Payer: Medicaid Other

## 2021-10-16 ENCOUNTER — Other Ambulatory Visit: Payer: Self-pay

## 2021-10-16 ENCOUNTER — Ambulatory Visit (INDEPENDENT_AMBULATORY_CARE_PROVIDER_SITE_OTHER): Payer: Medicaid Other | Admitting: General Practice

## 2021-10-16 VITALS — BP 139/88 | HR 89

## 2021-10-16 DIAGNOSIS — O24415 Gestational diabetes mellitus in pregnancy, controlled by oral hypoglycemic drugs: Secondary | ICD-10-CM

## 2021-10-16 NOTE — Progress Notes (Signed)
Pt informed that the ultrasound is considered a limited OB ultrasound and is not intended to be a complete ultrasound exam.  Patient also informed that the ultrasound is not being completed with the intent of assessing for fetal or placental anomalies or any pelvic abnormalities.  Explained that the purpose of today's ultrasound is to assess for  BPP, presentation, and AFI.  Patient acknowledges the purpose of the exam and the limitations of the study.     Patient denies dizziness, blurry vision, or headaches today.   Chase Caller RN BSN 10/16/21

## 2021-10-17 ENCOUNTER — Inpatient Hospital Stay (HOSPITAL_COMMUNITY): Payer: Medicaid Other

## 2021-10-17 ENCOUNTER — Other Ambulatory Visit: Payer: Self-pay

## 2021-10-17 ENCOUNTER — Inpatient Hospital Stay (HOSPITAL_COMMUNITY)
Admission: AD | Admit: 2021-10-17 | Discharge: 2021-10-22 | DRG: 805 | Disposition: A | Discharge status: Home / Self Care | Payer: Medicaid Other | Attending: Family Medicine | Admitting: Family Medicine

## 2021-10-17 ENCOUNTER — Encounter (HOSPITAL_COMMUNITY): Payer: Self-pay | Admitting: Obstetrics and Gynecology

## 2021-10-17 DIAGNOSIS — O1404 Mild to moderate pre-eclampsia, complicating childbirth: Secondary | ICD-10-CM | POA: Diagnosis present

## 2021-10-17 DIAGNOSIS — O99214 Obesity complicating childbirth: Secondary | ICD-10-CM | POA: Diagnosis present

## 2021-10-17 DIAGNOSIS — O24424 Gestational diabetes mellitus in childbirth, insulin controlled: Secondary | ICD-10-CM | POA: Diagnosis not present

## 2021-10-17 DIAGNOSIS — D62 Acute posthemorrhagic anemia: Secondary | ICD-10-CM | POA: Diagnosis not present

## 2021-10-17 DIAGNOSIS — O411230 Chorioamnionitis, third trimester, not applicable or unspecified: Secondary | ICD-10-CM | POA: Diagnosis present

## 2021-10-17 DIAGNOSIS — O9081 Anemia of the puerperium: Secondary | ICD-10-CM | POA: Diagnosis not present

## 2021-10-17 DIAGNOSIS — Z20822 Contact with and (suspected) exposure to covid-19: Secondary | ICD-10-CM | POA: Diagnosis present

## 2021-10-17 DIAGNOSIS — O1493 Unspecified pre-eclampsia, third trimester: Secondary | ICD-10-CM

## 2021-10-17 DIAGNOSIS — Z3A39 39 weeks gestation of pregnancy: Secondary | ICD-10-CM

## 2021-10-17 DIAGNOSIS — O24425 Gestational diabetes mellitus in childbirth, controlled by oral hypoglycemic drugs: Secondary | ICD-10-CM | POA: Diagnosis present

## 2021-10-17 DIAGNOSIS — Z7982 Long term (current) use of aspirin: Secondary | ICD-10-CM | POA: Diagnosis not present

## 2021-10-17 DIAGNOSIS — O24419 Gestational diabetes mellitus in pregnancy, unspecified control: Secondary | ICD-10-CM | POA: Diagnosis present

## 2021-10-17 DIAGNOSIS — O99210 Obesity complicating pregnancy, unspecified trimester: Secondary | ICD-10-CM | POA: Diagnosis present

## 2021-10-17 HISTORY — DX: Gestational diabetes mellitus in pregnancy, unspecified control: O24.419

## 2021-10-17 LAB — COMPREHENSIVE METABOLIC PANEL
ALT: 20 U/L (ref 0–44)
AST: 19 U/L (ref 15–41)
Albumin: 2.8 g/dL — ABNORMAL LOW (ref 3.5–5.0)
Alkaline Phosphatase: 178 U/L — ABNORMAL HIGH (ref 38–126)
Anion gap: 10 (ref 5–15)
BUN: 10 mg/dL (ref 6–20)
CO2: 20 mmol/L — ABNORMAL LOW (ref 22–32)
Calcium: 9.4 mg/dL (ref 8.9–10.3)
Chloride: 107 mmol/L (ref 98–111)
Creatinine, Ser: 0.55 mg/dL (ref 0.44–1.00)
GFR, Estimated: >60 mL/min (ref >60)
Glucose, Bld: 106 mg/dL — ABNORMAL HIGH (ref 70–99)
Potassium: 3.9 mmol/L (ref 3.5–5.1)
Sodium: 137 mmol/L (ref 135–145)
Total Bilirubin: 0.2 mg/dL — ABNORMAL LOW (ref 0.3–1.2)
Total Protein: 6.7 g/dL (ref 6.5–8.1)

## 2021-10-17 LAB — TYPE AND SCREEN
ABO/RH(D): B POS
Antibody Screen: NEGATIVE

## 2021-10-17 LAB — GLUCOSE, CAPILLARY
Glucose-Capillary: 109 mg/dL — ABNORMAL HIGH (ref 70–99)
Glucose-Capillary: 96 mg/dL (ref 70–99)

## 2021-10-17 LAB — CBC
HCT: 38.7 % (ref 36.0–46.0)
Hemoglobin: 12.6 g/dL (ref 12.0–15.0)
MCH: 27.7 pg (ref 26.0–34.0)
MCHC: 32.6 g/dL (ref 30.0–36.0)
MCV: 85.1 fL (ref 80.0–100.0)
Platelets: 269 10*3/uL (ref 150–400)
RBC: 4.55 MIL/uL (ref 3.87–5.11)
RDW: 15.1 % (ref 11.5–15.5)
WBC: 10 10*3/uL (ref 4.0–10.5)
nRBC: 0 % (ref 0.0–0.2)

## 2021-10-17 LAB — PROTEIN / CREATININE RATIO, URINE
Creatinine, Urine: 32.86 mg/dL
Protein Creatinine Ratio: 0.24 mg/mg{Cre} — ABNORMAL HIGH (ref 0.00–0.15)
Total Protein, Urine: 8 mg/dL

## 2021-10-17 LAB — RESP PANEL BY RT-PCR (FLU A&B, COVID) ARPGX2
Influenza A by PCR: NEGATIVE
Influenza B by PCR: NEGATIVE
SARS Coronavirus 2 by RT PCR: NEGATIVE

## 2021-10-17 MED ORDER — OXYCODONE-ACETAMINOPHEN 5-325 MG PO TABS: 1.0000 | ORAL_TABLET | ORAL | Status: DC | PRN

## 2021-10-17 MED ORDER — LACTATED RINGERS IV SOLN
500.0000 mL | INTRAVENOUS | Status: DC | PRN
Start: 2021-10-17 — End: 2021-10-20
  Administered 2021-10-19 (×2): 500 mL via INTRAVENOUS

## 2021-10-17 MED ORDER — MISOPROSTOL 25 MCG QUARTER TABLET
25.0000 ug | ORAL_TABLET | ORAL | Status: DC | PRN
  Administered 2021-10-17 – 2021-10-18 (×3): 25 ug via VAGINAL
  Filled 2021-10-17 (×3): qty 1

## 2021-10-17 MED ORDER — FENTANYL CITRATE (PF) 100 MCG/2ML IJ SOLN
50.0000 ug | INTRAMUSCULAR | Status: DC | PRN
Start: 2021-10-17 — End: 2021-10-20

## 2021-10-17 MED ORDER — ACETAMINOPHEN 325 MG PO TABS
650.0000 mg | ORAL_TABLET | ORAL | Status: DC | PRN
  Administered 2021-10-19 (×2): 650 mg via ORAL
  Filled 2021-10-17 (×2): qty 2

## 2021-10-17 MED ORDER — SOD CITRATE-CITRIC ACID 500-334 MG/5ML PO SOLN: 30.0000 mL | ORAL | Status: DC | PRN

## 2021-10-17 MED ORDER — OXYCODONE-ACETAMINOPHEN 5-325 MG PO TABS: 2.0000 | ORAL_TABLET | ORAL | Status: DC | PRN

## 2021-10-17 MED ORDER — ZOLPIDEM TARTRATE 5 MG PO TABS
5.0000 mg | ORAL_TABLET | Freq: Every evening | ORAL | Status: DC | PRN
  Administered 2021-10-17: 5 mg via ORAL
  Filled 2021-10-17: qty 1

## 2021-10-17 MED ORDER — TERBUTALINE SULFATE 1 MG/ML IJ SOLN: 0.2500 mg | Freq: Once | INTRAMUSCULAR | Status: DC | PRN

## 2021-10-17 MED ORDER — OXYTOCIN-SODIUM CHLORIDE 30-0.9 UT/500ML-% IV SOLN
2.5000 [IU]/h | INTRAVENOUS | Status: DC
  Administered 2021-10-20: 2.5 [IU]/h via INTRAVENOUS
  Filled 2021-10-17: qty 500

## 2021-10-17 MED ORDER — OXYTOCIN BOLUS FROM INFUSION
333.0000 mL | Freq: Once | INTRAVENOUS | Status: AC
  Administered 2021-10-20: 333 mL via INTRAVENOUS

## 2021-10-17 MED ORDER — ONDANSETRON HCL 4 MG/2ML IJ SOLN: 4.0000 mg | Freq: Four times a day (QID) | INTRAMUSCULAR | Status: DC | PRN

## 2021-10-17 MED ORDER — LIDOCAINE HCL (PF) 1 % IJ SOLN: 30.0000 mL | INTRAMUSCULAR | Status: DC | PRN

## 2021-10-17 MED ORDER — LACTATED RINGERS IV SOLN: INTRAVENOUS | Status: DC

## 2021-10-17 NOTE — H&P (Signed)
OBSTETRIC ADMISSION HISTORY AND PHYSICAL  Kristin Rios is a 20 y.o. female G1P0 with IUP at 40w0dby 11 wk [place] I[place] due to A2GDM. She reports +FMs, No LOF, no VB, no blurry vision, headaches or peripheral edema, and RUQ pain.  She plans on breast feeding. She is unsure for birth control. [place]->  Estimated Date of Delivery: 10/24/21  Sono:    @[redacted]w[redacted]d , CWD, normal anatomy, cephalic presentation, 33825K 84% EFW   Prenatal History/Complications:  --[id number] on metformin daily. Endorses overall control at home  --?Pre-eclampsia: She has had intermittent Bps elevated over the past week (but improve on recheck) with elevated P/C ratio on 11/7   Past Medical History: Past Medical History:  Diagnosis Date   Medical history non-contributory     Past Surgical History: Past Surgical History:  Procedure Laterality Date   WISDOM TOOTH EXTRACTION      Obstetrical History: OB History     Gravida  1   Para      Term      Preterm      AB      Living  0      SAB      IAB      Ectopic      Multiple      Live Births  0           Social History Social History   Socioeconomic History   Marital status: Single    Spouse name: Not on file   Number of children: Not on file   Years of education: Not on file   Highest education level: Not on file  Occupational History   Not on file  Tobacco Use   Smoking status: Never   Smokeless tobacco: Never  Vaping Use   Vaping Use: Former   Quit date: 12/08/2020  Substance and Sexual Activity   Alcohol use: Not Currently   Drug use: Not Currently   Sexual activity: Yes    Partners: Male    Birth control/protection: None  Other Topics Concern   Not on file  Social History Narrative   Not on file   Social Determinants of Health   Financial Resource Strain: Not on file  Food Insecurity: No Food Insecurity   Worried About RCharity fundraiserin  the Last Year: Never true   Ran Out of Food in the Last Year: Never true  Transportation Needs: Not on file  Physical Activity: Not on file  Stress: Not on file  Social Connections: Not on file    Family History: Family History  Problem Relation Age of Onset   Healthy Mother     Allergies: No Known Allergies  Medications Prior to Admission  Medication Sig Dispense Refill Last Dose   aspirin EC 81 MG tablet Take 1 tablet (81 mg total) by mouth daily. 60 tablet 2 10/17/2021   metFORMIN (GLUCOPHAGE) 500 MG tablet Take 1 tablet (500 mg total) by mouth daily with supper. 60 tablet 5 10/16/2021   Prenatal Vit-Fe Fumarate-FA (PREPLUS) 27-1 MG TABS Take 1 tablet by mouth daily. 30 tablet 13 10/17/2021   Accu-Chek Softclix Lancets lancets Use 1 lancet each to check blood glucose 4 times daily (Patient not taking: Reported on 10/14/2021) 100 each 5    Blood Glucose Monitoring Suppl (ACCU-CHEK GUIDE) w/Device KIT Use meter to check blood glucose 4 times daily 1 kit 0  Blood Pressure Monitoring (BLOOD PRESSURE KIT) DEVI 1 kit by Does not apply route once a week. 1 each 0    glucose blood (ACCU-CHEK GUIDE) test strip Use 1 test strip each to check blood glucose 4 times daily 100 each 5      Review of Systems   All systems reviewed and negative except as stated in HPI  Blood pressure (!) 156/93, pulse 87, temperature 98.1 F (36.7 C), temperature source Oral, last menstrual period 01/25/2021. General appearance: alert, cooperative, and no distress Lungs: Normal WOB  Heart: regular rate and rhythm Abdomen: soft, non-tender; Gravid uterus  Extremities: Homans sign is negative, no sign of DVT Presentation: cephalic Fetal monitoringBaseline: 150 bpm, Variability: Good {> 6 bpm), Accelerations: Reactive, and Decelerations: Absent Uterine activityNone Dilation: Closed Effacement (%): Thick Station: -3 Exam by:: Kristin Rios   Prenatal labs: ABO, Rh: --/--/PENDING (11/11 1805) Antibody:  PENDING (11/11 1805) Rubella: 2.79 (05/12 1118) RPR: Non Reactive (09/07 1058)  HBsAg: Negative (05/12 1118)  HIV: Non Reactive (09/07 1058)  GBS: Negative/-- (10/25 1034)  2 hr Glucola failed Genetic screening LR  Anatomy US normal with placental lakes   Prenatal Transfer Tool  Maternal Diabetes: Yes:  Diabetes Type:  Insulin/Medication controlled Genetic Screening: Normal Maternal Ultrasounds/Referrals: Normal Fetal Ultrasounds or other Referrals:  None Maternal Substance Abuse:  No Significant Maternal Medications:  Meds include: Other:  metformin  Significant Maternal Lab Results: Group B Strep negative  Results for orders placed or performed during the hospital encounter of 10/17/21 (from the past 24 hour(s))  Type and screen   Collection Time: 10/17/21  6:05 PM  Result Value Ref Range   ABO/RH(D) PENDING    Antibody Screen PENDING    Sample Expiration      10/20/2021,2359 Performed at [facility], [place] [address] [place], [place] [address]     Patient Active Problem List   Diagnosis Date Noted   Gestational diabetes mellitus 10/17/2021   Gestational diabetes mellitus (GDM) in third trimester 08/14/2021   Maternal obesity affecting pregnancy, antepartum 06/06/2021   Supervision of normal first teen pregnancy 04/07/2021    Assessment/Rios:  Kristin Rios is a 20 y.o. G1P0 at 28w0dhere for IOL due to [place]   #Labor: Placed vaginal cytotec x1. Rios for serial cervical exams.  #Pain: PRN #FWB: Cat 1  #ID: GBS negative  #MOF: Breastfeeding  #MOC: Undecided  #Circ: No   #A2GDM: CBG 109 on admit. Will monitor with q4 CBGs, can consider SSI if needed. EFW 84%ile at 38 weeks.   #Elevated blood pressures: BP 156/93 on admit. No symptoms. She has had some elevated blood pressures during the end of pregnancy, however had not been diagnosed with gestational hypertension. Elevated PC ratio on 11/7. Recheck labs today. Monitor BP closely.     SPatriciaann Clan DO  10/17/2021, 6:53 PM

## 2021-10-17 NOTE — Progress Notes (Signed)
Labor Progress Note Kristin Rios is a 20 y.o. G1P0 at [redacted]w[redacted]d who presented for IOL due to A2GDM.   S: Doing well. No concerns at this time.   O:  BP (!) 141/75   Pulse 83   Temp 98.4 F (36.9 C) (Oral)   Resp 17   Ht 5\' 5"  (1.651 m)   Wt 120 kg   LMP 01/25/2021   BMI 44.02 kg/m   EFM: Baseline 150 bpm, moderate variability, + accels, no decels  CVE: Dilation: Closed Effacement (%): Thick Cervical Position: Posterior Station: -2 Presentation: Vertex Exam by:: [email or web address], rnc   A&P: 20 y.o. G1P0 [redacted]w[redacted]d  #Labor: Patient remains closed on exam. Additional dose of vaginal Cytotec 25 mcg placed. Will reassess in 4 hours. Will place foley balloon when able.  #Pain: PRN #FWB: Cat 1  #GBS negative  #Mild pre-eclampsia: Mild range pressures and no severe symptoms at this time. Platelets and LFTs normal. Will continue to monitor.   #A2GDM: Last CBG 96. Will continue q4hr glucose checks. Can consider SSI as needed.   [redacted]w[redacted]d, MD 11:11 PM

## 2021-10-18 ENCOUNTER — Inpatient Hospital Stay (HOSPITAL_COMMUNITY): Payer: Medicaid Other | Admitting: Anesthesiology

## 2021-10-18 LAB — GLUCOSE, CAPILLARY
Glucose-Capillary: 84 mg/dL (ref 70–99)
Glucose-Capillary: 102 mg/dL — ABNORMAL HIGH (ref 70–99)
Glucose-Capillary: 95 mg/dL (ref 70–99)
Glucose-Capillary: 74 mg/dL (ref 70–99)
Glucose-Capillary: 89 mg/dL (ref 70–99)
Glucose-Capillary: 89 mg/dL (ref 70–99)

## 2021-10-18 LAB — RPR: RPR Ser Ql: NONREACTIVE

## 2021-10-18 LAB — CBC
HCT: 36.6 % (ref 36.0–46.0)
Hemoglobin: 12 g/dL (ref 12.0–15.0)
MCH: 27.6 pg (ref 26.0–34.0)
MCHC: 32.8 g/dL (ref 30.0–36.0)
MCV: 84.1 fL (ref 80.0–100.0)
Platelets: 236 10*3/uL (ref 150–400)
RBC: 4.35 MIL/uL (ref 3.87–5.11)
RDW: 15.1 % (ref 11.5–15.5)
WBC: 11.8 10*3/uL — ABNORMAL HIGH (ref 4.0–10.5)
nRBC: 0 % (ref 0.0–0.2)

## 2021-10-18 MED ORDER — LACTATED RINGERS IV SOLN
500.0000 mL | Freq: Once | INTRAVENOUS | Status: AC
  Administered 2021-10-18: 500 mL via INTRAVENOUS

## 2021-10-18 MED ORDER — EPHEDRINE 5 MG/ML INJ: 10.0000 mg | INTRAVENOUS | Status: DC | PRN

## 2021-10-18 MED ORDER — PHENYLEPHRINE 40 MCG/ML (10ML) SYRINGE FOR IV PUSH (FOR BLOOD PRESSURE SUPPORT)
80.0000 ug | PREFILLED_SYRINGE | INTRAVENOUS | Status: DC | PRN
  Filled 2021-10-18: qty 10

## 2021-10-18 MED ORDER — PHENYLEPHRINE 40 MCG/ML (10ML) SYRINGE FOR IV PUSH (FOR BLOOD PRESSURE SUPPORT): 80.0000 ug | PREFILLED_SYRINGE | INTRAVENOUS | Status: DC | PRN

## 2021-10-18 MED ORDER — BUPIVACAINE HCL (PF) 0.25 % IJ SOLN
INTRAMUSCULAR | Status: DC | PRN
  Administered 2021-10-18 (×2): 5 mL via EPIDURAL

## 2021-10-18 MED ORDER — OXYTOCIN-SODIUM CHLORIDE 30-0.9 UT/500ML-% IV SOLN
1.0000 m[IU]/min | INTRAVENOUS | Status: DC
  Administered 2021-10-18: 2 m[IU]/min via INTRAVENOUS
  Administered 2021-10-19: 20 m[IU]/min via INTRAVENOUS
  Filled 2021-10-18 (×2): qty 500

## 2021-10-18 MED ORDER — DIPHENHYDRAMINE HCL 50 MG/ML IJ SOLN: 12.5000 mg | INTRAMUSCULAR | Status: DC | PRN

## 2021-10-18 MED ORDER — TERBUTALINE SULFATE 1 MG/ML IJ SOLN: 0.2500 mg | Freq: Once | INTRAMUSCULAR | Status: DC | PRN

## 2021-10-18 MED ORDER — FENTANYL-BUPIVACAINE-NACL 0.5-0.125-0.9 MG/250ML-% EP SOLN
12.0000 mL/h | EPIDURAL | Status: DC | PRN
  Administered 2021-10-18 – 2021-10-20 (×3): 12 mL/h via EPIDURAL
  Filled 2021-10-18 (×3): qty 250

## 2021-10-18 MED ORDER — LIDOCAINE HCL (PF) 1 % IJ SOLN
INTRAMUSCULAR | Status: DC | PRN
  Administered 2021-10-18: 10 mL via EPIDURAL
  Administered 2021-10-19: 5 mL via EPIDURAL
  Administered 2021-10-19: 3 mL via EPIDURAL
  Administered 2021-10-19: 2 mL via EPIDURAL

## 2021-10-18 NOTE — Progress Notes (Addendum)
Labor Progress Note Kristin Rios is a 20 y.o. G1P0 at [redacted]w[redacted]d who presented for IOL due to A2GDM.   S: Resting comfortably. Feeling more cramping since 2nd dose of Cytotec. No concerns at this time.  O:  BP 140/81   Pulse 80   Temp 98.4 F (36.9 C) (Oral)   Resp 18   Ht 5\' 5"  (1.651 m)   Wt 120 kg   LMP 01/25/2021   BMI 44.02 kg/m   EFM: Baseline 145 bpm, moderate variability, + accels, no decels Toco: Contractions every 2-3 minutes  CVE: Dilation: 1 Effacement (%): 20, 30 Cervical Position: Posterior Station: -3 Presentation: Vertex Exam by:: Dyllan Hughett MD   A&P: 20 y.o. G1P0 [redacted]w[redacted]d   #Labor: Progressing well. Foley balloon placed this check. Mom and baby tolerated this well. 3rd dose of vaginal Cytotec also placed. Will reassess in 4 hours.  #Pain: PRN #FWB: Cat 1  #GBS negative  #Pre-eclampsia without severe features: Continues to remain asymptomatic. Mild range BP. Will continue to monitor.   #A2GDM: Continue q4hr glucose checks.  [redacted]w[redacted]d, MD 3:07 AM

## 2021-10-18 NOTE — Anesthesia Procedure Notes (Signed)
Epidural Patient location during procedure: OB Start time: 10/18/2021 4:13 PM End time: 10/18/2021 4:27 PM  Staffing Anesthesiologist: Lucretia Kern, MD Performed: anesthesiologist   Preanesthetic Checklist Completed: patient identified, IV checked, risks and benefits discussed, monitors and equipment checked, pre-op evaluation and timeout performed  Epidural Patient position: sitting Prep: DuraPrep Patient monitoring: heart rate, continuous pulse ox and blood pressure Approach: midline Location: L3-L4 Injection technique: LOR air  Needle:  Needle type: Tuohy  Needle gauge: 17 G Needle length: 9 cm Needle insertion depth: 9 cm Catheter type: closed end flexible Catheter size: 19 Gauge Catheter at skin depth: 14 cm Test dose: negative  Assessment Events: blood not aspirated, injection not painful, no injection resistance, no paresthesia and negative IV test  Additional Notes Reason for block:procedure for pain

## 2021-10-18 NOTE — Progress Notes (Signed)
Labor Progress Note Kristin Rios is a 20 y.o. G1P0 at [redacted]w[redacted]d who presented for IOL due to A2GDM.   S: Doing well. No concerns.   O:  BP (!) 146/73   Pulse 80   Temp 97.8 F (36.6 C) (Oral)   Resp 19   Ht 5\' 5"  (1.651 m)   Wt 120 kg   LMP 01/25/2021   BMI 44.02 kg/m   EFM: Baseline 140 bpm, moderate variability, + accels, no decels  CVE: Dilation: 3.5 Effacement (%): 50, 60 Cervical Position: Posterior Station: Ballotable Presentation: Vertex Exam by:: [email or web address], RN   A&P: 20 y.o. G1P0 [redacted]w[redacted]d   #Labor: Progressing well. S/p foley balloon and Cytotec x3. Will start Pitocin 2x2 and reassess in 4 hours. #Pain: PRN #FWB: Cat 1  #GBS negative  #Pre-eclampsia without severe features: Continues to have mild range blood pressures. No symptoms at this time. Continue to monitor.  #A2GDM: CBGs remain at goal. Continue q4hr glucose checks.  [redacted]w[redacted]d, MD 7:50 AM

## 2021-10-18 NOTE — Progress Notes (Signed)
Kristin Rios is a 20 y.o. G1P0 at [redacted]w[redacted]d   Subjective: Resting comfortably, denies questions, concerns. Family at bedside and supportive.   Objective: BP 140/78   Pulse 90   Temp 98 F (36.7 C) (Oral)   Resp 19   Ht 5\' 5"  (1.651 m)   Wt 120 kg   LMP 01/25/2021   BMI 44.02 kg/m  I/O last 3 completed shifts: In: 1234.2 [I.V.:1234.2] Out: -  No intake/output data recorded.  FHT:  FHR: 140 bpm, variability: moderate,  accelerations:  Present,  decelerations:  Absent UC:   irregular, every 1-5 minutes SVE:   Dilation: 4 Effacement (%): 60 Station: -2 Exam by:: [email or web address], CNM  Labs: Lab Results  Component Value Date   WBC 11.8 (H) 10/18/2021   HGB 12.0 10/18/2021   HCT 36.6 10/18/2021   MCV 84.1 10/18/2021   PLT 236 10/18/2021    Assessment / Plan:  Labor --S/p Cytotec x 2 + foley balloon --Pitocin 2 x 2 initiated at 0815. Currently infusing at 8 milliunits. Continue PRN --Toco tracing impacted by maternal habitus and desire to reposition. Not a candidate for AROM + IUPC at this time but held preemptive discussion for consideration with future exams.   Preeclampsia --No severe range BPs or severe symptoms. Continue to monitor. Reviewed severe symptoms with patient and family. Please notify team ASAP if new onset symptoms  A2GDM --CBGs WNL --Metformin held, clear liquid diet due to Pitocin augmentation  13/11/2021, CNM 10/18/2021, 11:57 AM

## 2021-10-18 NOTE — Progress Notes (Signed)
Labor Progress Note [name] is a 20 y.o. G1P0 at [redacted]w[redacted]d who presented for IOL due to A2GDM.   S: Doing well. Comfortable with epidural. No concerns at this time.  O:  BP 140/69   Pulse 90   Temp 98.6 F (37 C) (Oral)   Resp 16   Ht 5\' 5"  (1.651 m)   Wt 120 kg   LMP 01/25/2021   SpO2 98%   BMI 44.02 kg/m   EFM: Baseline 145 bpm, moderate variability, + accels, no decels  CVE: Dilation: 5 Effacement (%): 70 Cervical Position: Posterior Station: -2 Presentation: Vertex Exam by:: Johne Buckle, MD   A&P: 20 y.o. G1P0 [redacted]w[redacted]d   #Labor: SVE unchanged from last check. AROM performed with moderate amount of clear fluid. Fetal head well applied. IUPC placed to assess contraction adequacy and further aid in Pitocin titration. Will reassess in 3-4 hours.  #Pain: Epidural  #FWB: Cat 1  #GBS negative  #Pre-eclampsia w/o severe features: Continues to have normal to mild range pressures. No symptoms. Will continue to monitor.   #A2GDM: CBGs all within normal range. Continue q4 hour glucose checks.   [redacted]w[redacted]d, MD 10:02 PM

## 2021-10-18 NOTE — Anesthesia Preprocedure Evaluation (Signed)
Anesthesia Evaluation  Patient identified by MRN, date of birth, ID band Patient awake    Reviewed: Allergy & Precautions, H&P , NPO status , Patient's Chart, lab work & pertinent test results  History of Anesthesia Complications Negative for: history of anesthetic complications  Airway Mallampati: II  TM Distance: >3 FB     Dental   Pulmonary neg pulmonary ROS,    Pulmonary exam normal        Cardiovascular negative cardio ROS   Rhythm:regular Rate:Normal     Neuro/Psych negative neurological ROS  negative psych ROS   GI/Hepatic negative GI ROS, Neg liver ROS,   Endo/Other  diabetes, Gestational, Oral Hypoglycemic AgentsMorbid obesity  Renal/GU      Musculoskeletal   Abdominal   Peds  Hematology negative hematology ROS (+)   Anesthesia Other Findings   Reproductive/Obstetrics (+) Pregnancy                             Anesthesia Physical Anesthesia Plan  ASA: 3  Anesthesia Plan: Epidural   Post-op Pain Management:    Induction:   PONV Risk Score and Plan:   Airway Management Planned:   Additional Equipment:   Intra-op Plan:   Post-operative Plan:   Informed Consent: I have reviewed the patients History and Physical, chart, labs and discussed the procedure including the risks, benefits and alternatives for the proposed anesthesia with the patient or authorized representative who has indicated his/her understanding and acceptance.       Plan Discussed with:   Anesthesia Plan Comments:         Anesthesia Quick Evaluation

## 2021-10-18 NOTE — Progress Notes (Signed)
Kristin Rios is a 20 y.o. G1P0 at [redacted]w[redacted]d   Subjective: Resting comfortably immediate s/p epidural placement. Desires nap  Objective: BP 138/72   Pulse 89   Temp 98.8 F (37.1 C) (Oral)   Resp 19   Ht 5\' 5"  (1.651 m)   Wt 120 kg   LMP 01/25/2021   SpO2 98%   BMI 44.02 kg/m  I/O last 3 completed shifts: In: 1234.2 [I.V.:1234.2] Out: -  No intake/output data recorded.  FHT:  FHR: 145 bpm, variability: moderate,  accelerations:  Present,  decelerations:  Absent UC:   regular, every 2-4 minutes SVE:   Dilation: 5 Effacement (%): 70 Station: -2 Exam by:: [email or web address], RN  Labs: Lab Results  Component Value Date   WBC 11.8 (H) 10/18/2021   HGB 12.0 10/18/2021   HCT 36.6 10/18/2021   MCV 84.1 10/18/2021   PLT 236 10/18/2021    Assessment / Plan: --20 y.o. G1P0 at [redacted]w[redacted]d, A2GDM and PEC  Labor --Pitocin infusing at 14 milliunits --Epidural in place and effective --Toco currently tracing well, patient ready to nap --Will defer AROM + IUPC placement at this time, likely place next check --Will initiate position changes with peanut ball around 1830 or earlier if desired by patient  Preeclampsia --Mild elevation, no severe range, no severe symptoms  A2GDM --All readings WNL  Anticipate vaginal delivery  [redacted]w[redacted]d, CNM 10/18/2021, 5:34 PM

## 2021-10-18 NOTE — Progress Notes (Signed)
Tracing reviewed with Lexi, RN. Patient desires epidural before any additional intervention. Cat I tracing, mildly elevated pressures. Will recheck cervix and assess for AROM and IUPC placement when comfortable s/p epidural.  Calvert Cantor, CNM 10/18/2021, [phone]

## 2021-10-19 LAB — GLUCOSE, CAPILLARY
Glucose-Capillary: 81 mg/dL (ref 70–99)
Glucose-Capillary: 99 mg/dL (ref 70–99)
Glucose-Capillary: 96 mg/dL (ref 70–99)
Glucose-Capillary: 84 mg/dL (ref 70–99)
Glucose-Capillary: 80 mg/dL (ref 70–99)

## 2021-10-19 MED ORDER — SODIUM CHLORIDE 0.9 % IV SOLN
2.0000 g | Freq: Four times a day (QID) | INTRAVENOUS | Status: DC
  Administered 2021-10-19: 2 g via INTRAVENOUS
  Filled 2021-10-19: qty 2000

## 2021-10-19 MED ORDER — GENTAMICIN SULFATE 40 MG/ML IJ SOLN
5.0000 mg/kg | Freq: Once | INTRAVENOUS | Status: AC
  Administered 2021-10-20: 600 mg via INTRAVENOUS
  Filled 2021-10-19: qty 15

## 2021-10-19 NOTE — Progress Notes (Signed)
Kristin Rios is a 20 y.o. G1P0 at [redacted]w[redacted]d  admitted for induction of labor due to A2GDM on Metformin.  Subjective: Reports does not feel pain through the epidural.   Objective: BP (!) 121/53   Pulse 65   Temp 97.9 F (36.6 C) (Oral)   Resp 16   Ht 5\' 5"  (1.651 m)   Wt 120 kg   LMP 01/25/2021   SpO2 98%   BMI 44.02 kg/m  I/O last 3 completed shifts: In: 1234.2 [I.V.:1234.2] Out: 1250 [Urine:1250] Total I/O In: -  Out: 1200 [Urine:1200]  FHT:  FHR: 135 bpm, variability: moderate,  accelerations:  Present,  decelerations:  Absent UC:   regular, every 2-4 minutes SVE:   Dilation: 5.5 Effacement (%): 90 Station: -2 Exam by:: [email or web address], MD  Labs: Lab Results  Component Value Date   WBC 11.8 (H) 10/18/2021   HGB 12.0 10/18/2021   HCT 36.6 10/18/2021   MCV 84.1 10/18/2021   PLT 236 10/18/2021    Assessment / Plan: Induction of labor due to A2GDM started on 11/11, s/p cytotec and FB, on Pitocin for ~24 hours with IUPC in place. Cervical dilation unchanged in last 3 checks. On exam caput appreciated with occiput asynclitic. Discussed pitocin break since patient on pitocin for >24 hours.  -Will give 2 hour pitocin break.  - Also discussed as team with bedside RN and patient - during pit break will move patient side to side and use spinning babies techniques throughout and use peanut ball to assist with better engagement of occiput into cervix.    Fetal Wellbeing:  Category I Pain Control:  Epidural I/D:   GBS neg   #A2GDM Glucose in 80s-90s. Continue q4hr checks 13/11 10/19/2021, 9:46 AM

## 2021-10-19 NOTE — Progress Notes (Signed)
Labor Progress Note Kristin Rios is a 20 y.o. G1P0 at [redacted]w[redacted]d who presented for IOL due to A2GDM.  S: Doing okay. Feeling a little more discomfort since being in flying cowgirl position. No concerns at this time.   O:  BP (!) 143/79   Pulse 76   Temp 98.6 F (37 C) (Axillary)   Resp 18   Ht 5\' 5"  (1.651 m)   Wt 120 kg   LMP 01/25/2021   SpO2 98%   BMI 44.02 kg/m   EFM: Baseline 145 bpm, moderate variability, no accels, no decels  SVE: Dilation: 10 Dilation Complete Date: 10/19/21 Dilation Complete Time: 2059 Effacement (%): 100 Cervical Position: Posterior Station: 0 Presentation: Vertex Exam by:: Dr. [email or web address]   A&P: 20 y.o. G1P0 [redacted]w[redacted]d   #Labor: Patient now complete and 0 station. Not yet feeling constant pressure or urge to push. Will continue in current positioning and continue Pitocin. Will reassess in 1 hour.  #Pain: Epidural  #FWB: Cat 1  #GBS negative  #Pre-eclampsia w/o severe features: Normal to mild range BP. No symptoms. Will continue to monitor.   #A2GDM: CBGs remain at goal. Continue q4 hour glucose checks.   [redacted]w[redacted]d, MD 9:35 PM

## 2021-10-19 NOTE — Progress Notes (Signed)
Labor Progress Note Kristin Rios is a 20 y.o. G1P0 at [redacted]w[redacted]d who presented for IOL due to A2GDM.  S: Resting comfortably. No concerns.   O:  BP 122/68   Pulse 100   Temp 97.9 F (36.6 C) (Oral)   Resp 16   Ht 5\' 5"  (1.651 m)   Wt 120 kg   LMP 01/25/2021   SpO2 98%   BMI 44.02 kg/m   EFM: Baseline 135 bpm, moderate variability, + accels, no decels  Toco: Contractions every 2-3 minutes (MVUs > 200); last 10 minute period 205 MVUs  CVE: Dilation: 5.5 Effacement (%): 80 Cervical Position: Posterior Station: -3 Presentation: Vertex Exam by:: Dr. [email or web address]  A&P: 20 y.o. G1P0 [redacted]w[redacted]d   #Labor: SVE largely unchanged from last check. Slightly more effaced. Majority of cervix remains on the right side. Pitocin at 18 milli-units/min, just able to titrate up since 1 am. IUPC with adequate contraction pattern. Will continue Pitocin for now. If SVE remains unchanged on next check, will consider Pitocin break.  #Pain: Epidural  #FWB: Cat 1  #GBS negative  #Pre-eclampsia w/o severe features: Normal BP. No symptoms. Continue to monitor.   #A2GDM: CBGs within normal range. Continue q4hr glucose checks.  [redacted]w[redacted]d, MD 3:15 AM

## 2021-10-19 NOTE — Anesthesia Procedure Notes (Signed)
Epidural Patient location during procedure: OB Start time: 10/19/2021 12:25 AM End time: 10/19/2021 12:32 AM  Staffing Anesthesiologist: Marcene Duos, MD Performed: anesthesiologist   Preanesthetic Checklist Completed: patient identified, IV checked, site marked, risks and benefits discussed, surgical consent, monitors and equipment checked, pre-op evaluation and timeout performed  Epidural Patient position: sitting Prep: DuraPrep and site prepped and draped Patient monitoring: continuous pulse ox and blood pressure Approach: midline Location: L4-L5 Injection technique: LOR air  Needle:  Needle type: Tuohy  Needle gauge: 17 G Needle length: 9 cm and 9 Needle insertion depth: 9 cm Catheter type: closed end flexible Catheter size: 19 Gauge Catheter at skin depth: 17 cm Test dose: negative  Assessment Events: blood not aspirated, injection not painful, no injection resistance, no paresthesia and negative IV test

## 2021-10-19 NOTE — Progress Notes (Signed)
Kristin Rios is a 20 y.o. G1P0 at [redacted]w[redacted]d  admitted for induction of labor due to A2GDM on Metformin.  Subjective: Reports feeling ok.   Objective: BP 128/62   Pulse 68   Temp 99.6 F (37.6 C) (Axillary)   Resp 16   Ht 5\' 5"  (1.651 m)   Wt 120 kg   LMP 01/25/2021   SpO2 98%   BMI 44.02 kg/m  I/O last 3 completed shifts: In: 1234.2 [I.V.:1234.2] Out: 1250 [Urine:1250] Total I/O In: -  Out: 2300 [Urine:2300]  FHT:  FHR: 140 bpm, variability: moderate,  accelerations:  Present,  decelerations:  Absent UC:   regular, every 3-5 minutes SVE:   Dilation: 5.5 Effacement (%): 80, 90 Station: -2 Exam by:: [email or web address], MD  Labs: Lab Results  Component Value Date   WBC 11.8 (H) 10/18/2021   HGB 12.0 10/18/2021   HCT 36.6 10/18/2021   MCV 84.1 10/18/2021   PLT 236 10/18/2021    Assessment / Plan: Labor:  Now s/p pitocin break. Cervix unchanged during break as anticipated. FSE placed due to difficulty tracing with position changes. Restart pitocin at 4/hr and uptitrate every 30 min. IUPC still in place and currently contractions not adequate. Continue to uptitrate and recheck in 4 hrs around 1540.  Fetal Wellbeing:  Category I Pain Control:  Epidural I/D:   GBS negative   13/11/2021 10/19/2021, 1:23 PM

## 2021-10-20 ENCOUNTER — Encounter (HOSPITAL_COMMUNITY): Payer: Self-pay | Admitting: Obstetrics and Gynecology

## 2021-10-20 DIAGNOSIS — Z3A39 39 weeks gestation of pregnancy: Secondary | ICD-10-CM

## 2021-10-20 DIAGNOSIS — O1404 Mild to moderate pre-eclampsia, complicating childbirth: Secondary | ICD-10-CM

## 2021-10-20 DIAGNOSIS — O24424 Gestational diabetes mellitus in childbirth, insulin controlled: Secondary | ICD-10-CM

## 2021-10-20 LAB — CBC
HCT: 29.9 % — ABNORMAL LOW (ref 36.0–46.0)
Hemoglobin: 9.9 g/dL — ABNORMAL LOW (ref 12.0–15.0)
MCH: 28.4 pg (ref 26.0–34.0)
MCHC: 33.1 g/dL (ref 30.0–36.0)
MCV: 85.7 fL (ref 80.0–100.0)
Platelets: 215 10*3/uL (ref 150–400)
RBC: 3.49 MIL/uL — ABNORMAL LOW (ref 3.87–5.11)
RDW: 15.4 % (ref 11.5–15.5)
WBC: 19.3 10*3/uL — ABNORMAL HIGH (ref 4.0–10.5)
nRBC: 0 % (ref 0.0–0.2)

## 2021-10-20 LAB — GLUCOSE, CAPILLARY: Glucose-Capillary: 113 mg/dL — ABNORMAL HIGH (ref 70–99)

## 2021-10-20 MED ORDER — DIPHENHYDRAMINE HCL 25 MG PO CAPS: 25.0000 mg | ORAL_CAPSULE | Freq: Four times a day (QID) | ORAL | Status: DC | PRN

## 2021-10-20 MED ORDER — MEASLES, MUMPS & RUBELLA VAC IJ SOLR: 0.5000 mL | Freq: Once | INTRAMUSCULAR | Status: DC

## 2021-10-20 MED ORDER — IBUPROFEN 600 MG PO TABS
600.0000 mg | ORAL_TABLET | Freq: Four times a day (QID) | ORAL | Status: DC
  Administered 2021-10-20 – 2021-10-22 (×10): 600 mg via ORAL
  Filled 2021-10-20 (×11): qty 1

## 2021-10-20 MED ORDER — ONDANSETRON HCL 4 MG/2ML IJ SOLN: 4.0000 mg | INTRAMUSCULAR | Status: DC | PRN

## 2021-10-20 MED ORDER — ACETAMINOPHEN 325 MG PO TABS
650.0000 mg | ORAL_TABLET | ORAL | Status: DC | PRN
  Administered 2021-10-20 – 2021-10-22 (×2): 650 mg via ORAL
  Filled 2021-10-20 (×2): qty 2

## 2021-10-20 MED ORDER — MEDROXYPROGESTERONE ACETATE 150 MG/ML IM SUSP: 150.0000 mg | INTRAMUSCULAR | Status: DC | PRN

## 2021-10-20 MED ORDER — TRANEXAMIC ACID-NACL 1000-0.7 MG/100ML-% IV SOLN: 1000.0000 mg | INTRAVENOUS | Status: AC

## 2021-10-20 MED ORDER — PRENATAL MULTIVITAMIN CH
1.0000 | ORAL_TABLET | Freq: Every day | ORAL | Status: DC
  Administered 2021-10-20 – 2021-10-22 (×3): 1 via ORAL
  Filled 2021-10-20 (×4): qty 1

## 2021-10-20 MED ORDER — TRANEXAMIC ACID-NACL 1000-0.7 MG/100ML-% IV SOLN
INTRAVENOUS | Status: AC
  Administered 2021-10-20: 1000 mg via INTRAVENOUS
  Filled 2021-10-20: qty 100

## 2021-10-20 MED ORDER — WITCH HAZEL-GLYCERIN EX PADS: 1.0000 "application " | MEDICATED_PAD | CUTANEOUS | Status: DC | PRN

## 2021-10-20 MED ORDER — BENZOCAINE-MENTHOL 20-0.5 % EX AERO
1.0000 "application " | INHALATION_SPRAY | CUTANEOUS | Status: DC | PRN
  Administered 2021-10-20: 1 via TOPICAL
  Filled 2021-10-20: qty 56

## 2021-10-20 MED ORDER — SIMETHICONE 80 MG PO CHEW: 80.0000 mg | CHEWABLE_TABLET | ORAL | Status: DC | PRN

## 2021-10-20 MED ORDER — DIBUCAINE (PERIANAL) 1 % EX OINT: 1.0000 "application " | TOPICAL_OINTMENT | CUTANEOUS | Status: DC | PRN

## 2021-10-20 MED ORDER — MISOPROSTOL 200 MCG PO TABS
ORAL_TABLET | ORAL | Status: AC
  Administered 2021-10-20: 25 ug via VAGINAL
  Filled 2021-10-20: qty 5

## 2021-10-20 MED ORDER — TETANUS-DIPHTH-ACELL PERTUSSIS 5-2.5-18.5 LF-MCG/0.5 IM SUSY: 0.5000 mL | PREFILLED_SYRINGE | Freq: Once | INTRAMUSCULAR | Status: DC

## 2021-10-20 MED ORDER — SENNOSIDES-DOCUSATE SODIUM 8.6-50 MG PO TABS
2.0000 | ORAL_TABLET | Freq: Every day | ORAL | Status: DC
  Administered 2021-10-21 – 2021-10-22 (×2): 2 via ORAL
  Filled 2021-10-20 (×2): qty 2

## 2021-10-20 MED ORDER — ONDANSETRON HCL 4 MG PO TABS: 4.0000 mg | ORAL_TABLET | ORAL | Status: DC | PRN

## 2021-10-20 MED ORDER — COCONUT OIL OIL: 1.0000 "application " | TOPICAL_OIL | Status: DC | PRN

## 2021-10-20 NOTE — Lactation Note (Signed)
This note was copied from a baby's chart. Lactation Consultation Note  Patient Name: Boy Janeshia Ciliberto IPJAS'N Date: 10/20/2021 Reason for consult: Initial assessment;Term;Primapara;1st time breastfeeding Age:19 hours   P1 mother whose infant is now 74 hours old.  This is a term baby at 39+2 weeks.  Mother's feeding preference is formula at this time.  She is interested in pumping and bottle feeding.  Offered to initiate the DEBP ; mother interested.  However, as I was setting pump up her breakfast tray arrived.  Allowed time for her to eat breakfast.  Mother will call for my assistance when she finishes breakfast.  Further education will be provided at that time.   Maternal Data Has patient been taught Hand Expression?: No Does the patient have breastfeeding experience prior to this delivery?: No  Feeding Mother's Current Feeding Choice: Breast Milk and Formula Nipple Type: Slow - flow  LATCH Score                    Lactation Tools Discussed/Used    Interventions    Discharge Pump: Personal;Manual;DEBP  Consult Status Consult Status: Follow-up Date: 10/21/21 Follow-up type: In-patient    Fernand Sorbello R Lyndon Chenoweth 10/20/2021, 7:40 AM

## 2021-10-20 NOTE — Anesthesia Postprocedure Evaluation (Signed)
Anesthesia Post Note  Patient: Kristin Rios  Procedure(s) Performed: AN AD HOC LABOR EPIDURAL     Patient location during evaluation: Mother Baby Anesthesia Type: Epidural Level of consciousness: awake, oriented and awake and alert Pain management: pain level controlled Vital Signs Assessment: post-procedure vital signs reviewed and stable Respiratory status: spontaneous breathing, respiratory function stable and nonlabored ventilation Cardiovascular status: stable Postop Assessment: no headache, adequate PO intake, patient able to bend at knees, no backache and no apparent nausea or vomiting (Has not ambulated yet but can bend up and move legs.) Anesthetic complications: no   No notable events documented.  Last Vitals:  Vitals:   10/20/21 0455 10/20/21 0605  BP: 122/67 121/73  Pulse: (!) 105 87  Resp: 20 20  Temp: 36.9 C 37.3 C  SpO2: 100% 99%    Last Pain:  Vitals:   10/20/21 0605  TempSrc: Oral  PainSc: 0-No pain   Pain Goal:                   Delecia Vastine

## 2021-10-20 NOTE — Anesthesia Postprocedure Evaluation (Deleted)
Anesthesia Post Note  Patient: Kristin Rios  Procedure(s) Performed: AN AD HOC LABOR EPIDURAL     Patient location during evaluation: Mother Baby Anesthesia Type: Epidural Level of consciousness: awake, oriented and awake and alert Pain management: pain level controlled Vital Signs Assessment: post-procedure vital signs reviewed and stable Respiratory status: spontaneous breathing, respiratory function stable and nonlabored ventilation Cardiovascular status: stable Postop Assessment: no headache, adequate PO intake, able to ambulate, patient able to bend at knees, no backache and no apparent nausea or vomiting Anesthetic complications: no   No notable events documented.  Last Vitals:  Vitals:   10/20/21 0455 10/20/21 0605  BP: 122/67 121/73  Pulse: (!) 105 87  Resp: 20 20  Temp: 36.9 C 37.3 C  SpO2: 100% 99%    Last Pain:  Vitals:   10/20/21 0605  TempSrc: Oral  PainSc: 0-No pain   Pain Goal:                   Kristin Rios

## 2021-10-20 NOTE — Progress Notes (Signed)
Patient's first time to restroom (post delivery and post epidural), stedy was at the bedside. Patient moved her legs to side on bed on her own. Stated she could feel feet on floor. Had patient stand at side on bed. Left leg was weak and left leg slowly lowered to the floor. This RN was at the patients side. Assisted to sitting position on the side of the bed.Patient stated she didn't have any pain and assessment was unremarkable.Assisted patient to the restroom by stedy to void. Notified Dr. Pearlean Brownie.

## 2021-10-20 NOTE — Discharge Summary (Signed)
   Postpartum Discharge Summary  Date of Service updated 10/22/21     Patient Name: Kristin Rios DOB: 03/15/2001 MRN: [id number]  Date of admission: 10/17/2021 Delivery date:10/20/2021  Delivering provider: ALBERT, CHRISTINA M  Date of discharge: 10/22/2021  Admitting diagnosis: Gestational diabetes mellitus [O24.419] Intrauterine pregnancy: [redacted]w[redacted]d     Secondary diagnosis:  Principal Problem:   SVD (spontaneous vaginal delivery) Active Problems:   Maternal obesity affecting pregnancy, antepartum   Gestational diabetes mellitus   Postpartum hemorrhage  Additional problems: Pre-eclampsia without severe features    Discharge diagnosis: Term Pregnancy Delivered                                              Post partum procedures: venofer infusion Augmentation: AROM, Pitocin, Cytotec, and IP Foley Complications: Hemorrhage>1000mL  Hospital course: Induction of Labor With Vaginal Delivery   20 y.o. yo G1P0 at [redacted]w[redacted]d was admitted to the hospital 10/17/2021 for induction of labor.  Indication for induction: A2 DM.  Patient started her induction with Cytotec and foley balloon placement, followed by AROM and Pitocin. She had a prolonged latent phase. IUPC and FSE were placed for monitoring and Pitocin titration. She developed a fever while in labor and was started on Gentamycin and Ampicillin. Patient ultimately progressed to complete and had a vaginal delivery complicated by a postpartum hemorrhage of 1290 cc, for which she received Pitocin, TXA, and Cytotec.  Membrane Rupture Time/Date: 9:40 PM ,10/18/2021   Delivery Method:Vaginal, Spontaneous  Episiotomy: None  Lacerations:  Labial  Details of delivery can be found in separate delivery note.  Patient had a routine postpartum course besides her hemorrhage. Her CBC postpartum was 9.9. Patient is discharged home 10/22/21.  Newborn Data: Birth date:10/20/2021  Birth time:2:02 AM  Gender:Female  Living status:Living  Apgars:5 ,7   Weight:3550 g   Magnesium Sulfate received: No BMZ received: No Rhophylac:N/A MMR:N/A T-DaP:Given prenatally Flu: Given prenatally  Transfusion:No  Physical exam  Vitals:   10/21/21 1045 10/21/21 1324 10/21/21 2204 10/22/21 0500  BP: (!) 142/69 132/73 128/64 (!) 94/46  Pulse: 89 85 94 78  Resp: 20 18 18   Temp: 97.8 F (36.6 C) 97.6 F (36.4 C) 98.3 F (36.8 C) 98.4 F (36.9 C)  TempSrc: Oral Oral Oral   SpO2: 99% 100% 99% 99%  Weight:      Height:       General: alert, cooperative, and no distress Lochia: appropriate Uterine Fundus: firm Incision: N/A DVT Evaluation: No evidence of DVT seen on physical exam. Negative Homan's sign. No cords or calf tenderness. No significant calf/ankle edema.  Labs: Lab Results  Component Value Date   WBC 19.3 (H) 10/20/2021   HGB 9.9 (L) 10/20/2021   HCT 29.9 (L) 10/20/2021   MCV 85.7 10/20/2021   PLT 215 10/20/2021   CMP Latest Ref Rng & Units 10/17/2021  Glucose 70 - 99 mg/dL 106(H)  BUN 6 - 20 mg/dL 10  Creatinine 0.44 - 1.00 mg/dL 0.55  Sodium 135 - 145 mmol/L 137  Potassium 3.5 - 5.1 mmol/L 3.9  Chloride 98 - 111 mmol/L 107  CO2 22 - 32 mmol/L 20(L)  Calcium 8.9 - 10.3 mg/dL 9.4  Total Protein 6.5 - 8.1 g/dL 6.7  Total Bilirubin 0.3 - 1.2 mg/dL 0.2(L)  Alkaline Phos 38 - 126 U/L 178(H)  AST 15 - 41 U/L 19    ALT 0 - 44 U/L 20   Edinburgh Score: Edinburgh Postnatal Depression Scale Screening Tool 10/20/2021  I have been able to laugh and see the funny side of things. (No Data)     After visit meds:  Allergies as of 10/22/2021   No Known Allergies      Medication List     STOP taking these medications    Accu-Chek Guide test strip Generic drug: glucose blood   Accu-Chek Guide w/Device Kit   Accu-Chek Softclix Lancets lancets   aspirin EC 81 MG tablet   metFORMIN 500 MG tablet Commonly known as: GLUCOPHAGE       TAKE these medications    acetaminophen 325 MG tablet Commonly known as:  Tylenol Take 2 tablets (650 mg total) by mouth every 4 (four) hours as needed (for pain scale < 4).   Blood Pressure Kit Devi 1 kit by Does not apply route once a week.   ferrous sulfate 325 (65 FE) MG tablet Take 1 tablet (325 mg total) by mouth every other day.   ibuprofen 600 MG tablet Commonly known as: ADVIL Take 1 tablet (600 mg total) by mouth every 6 (six) hours.   PrePLUS 27-1 MG Tabs Take 1 tablet by mouth daily.   senna-docusate 8.6-50 MG tablet Commonly known as: Senokot-S Take 2 tablets by mouth at bedtime as needed for mild constipation.   simethicone 80 MG chewable tablet Commonly known as: MYLICON Chew 1 tablet (80 mg total) by mouth as needed for flatulence.         Discharge home in stable condition Infant Feeding: Bottle and Breast Infant Disposition:home with mother Discharge instruction: per After Visit Summary and Postpartum booklet. Activity: Advance as tolerated. Pelvic rest for 6 weeks.  Diet: routine diet Future Appointments: Future Appointments  Date Time Provider Department Center  10/27/2021  1:20 PM [facility] NURSE [facility] None  12/02/2021  9:15 AM [facility] LAB [facility] None  12/02/2021 10:15 AM Pratt, Tanya S, MD [facility] None   Follow up Visit:  Follow-up Information     [facility] Follow up on 10/27/2021.   Specialty: Obstetrics and Gynecology Why: nurse visit at 1:20 pm Contact information: [address] [place] [address] [phone]        [facility] Follow up on 12/02/2021.   Specialty: Obstetrics and Gynecology Why: Lab visit at 10:15 AM Contact information: [address] [place] [address] [phone]        Pratt, Tanya S, MD Follow up on 12/02/2021.   Specialty: Obstetrics and Gynecology Why: at 10:15 AM for postpartum visit Contact information: [address] [place] [address] [phone]                Message sent to [facility] by Dr. Albert on 10/20/21.  Please schedule this patient for a In person postpartum visit in 6 weeks with the following provider: Any provider. Additional Postpartum F/U: 2 hour GTT and BP check 1 week  High risk pregnancy complicated by: GDM and Pre-eclampsia Delivery mode:  Vaginal, Spontaneous  Anticipated Birth Control:  Unsure   10/22/2021 Audrey Faber, MD    

## 2021-10-20 NOTE — Progress Notes (Signed)
Labor Progress Note Kristin Rios is a 20 y.o. G1P0 at [redacted]w[redacted]d who presented for IOL due to A2GDM.  S: Has started pushing and is doing well. Family at bedside. No concerns.   O:  BP 105/89   Pulse 95   Temp (!) 100.5 F (38.1 C) (Axillary)   Resp 18   Ht 5\' 5"  (1.651 m)   Wt 120 kg   LMP 01/25/2021   SpO2 98%   BMI 44.02 kg/m   EFM: Baseline 160 bpm, moderate variability, + accels, no decels  SVE: 10/100/+1  A&P: 20 y.o. G1P0 [redacted]w[redacted]d   #Labor: Progressing well. Has had some fetal head descent with pushing. Will continue to push and reassess in 1 hour.  #Pain: Epidural  #FWB: Cat 1  #GBS negative  #A2GDM: Normal CBGs. Continue to monitor q4hr.  #Pre-eclampsia: No severe symptoms. BP normal to mild range. Will continue to monitor.   [redacted]w[redacted]d, MD 1:29 AM

## 2021-10-21 MED ORDER — SODIUM CHLORIDE 0.9 % IV SOLN
500.0000 mg | Freq: Once | INTRAVENOUS | Status: AC
  Administered 2021-10-21: 500 mg via INTRAVENOUS
  Filled 2021-10-21: qty 25

## 2021-10-21 NOTE — Progress Notes (Signed)
Post Partum Day 1 Subjective: Doing well. No acute events overnight. Pain is controlled and bleeding is appropriate. She is eating, drinking, voiding, and ambulating. She is formula feeding which is going well. She has no other concerns at this time.  Objective: Blood pressure 117/72, pulse 91, temperature 97.9 F (36.6 C), temperature source Oral, resp. rate 18, height 5\' 5"  (1.651 m), weight 120 kg, last menstrual period 01/25/2021, SpO2 100 %, unknown if currently breastfeeding.  Physical Exam:  General: alert, cooperative, and no distress Lochia: appropriate Uterine Fundus: firm and below umbilicus  DVT Evaluation: trace nonpitting edema to lower extremities bilaterally  Recent Labs    10/18/21 1049 10/20/21 0355  HGB 12.0 9.9*  HCT 36.6 29.9*    Assessment/Plan: Kristin Rios is a 20 y.o. G1P1001 on PPD# 1 s/p SVD.  Progressing well. Meeting postpartum milestones. VSS. Continue routine postpartum care.  #A2GDM: - Plan to check fasting CBG tomorrow AM  #Acute blood loss anemia: - Hgb down to 9.9 at 1 hour after delivery (EBL 1290 cc) - IV Venofer ordered   #Pre-eclampsia: - Normal BP postpartum - No symptoms - Will continue to monitor closely   Feeding: Formula Contraception: Undecided  Circumcision: No  Dispo: Plan for discharge on PPD#2.    LOS: 4 days   12, MD  10/21/2021, 9:53 AM

## 2021-10-21 NOTE — Lactation Note (Signed)
This note was copied from a baby's chart. Lactation Consultation Note  Patient Name: Kristin Rios HERDE'Y Date: 10/21/2021 Reason for consult: Follow-up assessment;Mother's request;Term;Maternal endocrine disorder Age:20 years  Infant recently fed formula before LC arrival. Mom mostly offering bottles. We talked about her breastfeeding plans, she stated afraid to latch him wrong. Mom to call for latch assistance with next feeding.   Mom encouraged to pump with DEBP q 3hrs for .   Plan 1. To feed based on cues 8-12x 24hr period. Mom to offer breasts and look for signs of milk transfer.  2. Mom to supplement with EBM first followed by formula with yellow slow flow nipple and pace bottle feeding 7-12 ml. BF supplementation guide provided. Mom aware if infant not latching at breast, offer more volume 15-30 ml per feeding.  3. Mom to use DEBP as stated above.  All questions answered at the end of the visit.    Maternal Data    Feeding Mother's Current Feeding Choice: Breast Milk and Formula Nipple Type: Slow - flow  LATCH Score                    Lactation Tools Discussed/Used Tools: Pump;Flanges Flange Size: 24 Breast pump type: Double-Electric Breast Pump Pump Education: Setup, frequency, and cleaning;Milk Storage Reason for Pumping: increase stimulation Pumping frequency: every 3 hrs for 15 min  Interventions Interventions: Breast feeding basics reviewed;Support pillows;Education;Pace feeding;Skin to skin;Expressed milk;Breast massage;Hand express;Infant Driven Feeding Algorithm education;DEBP;Breast compression  Discharge Pump: Personal  Consult Status Consult Status: Follow-up Date: 10/22/21 Follow-up type: In-patient    Esthefany Herrig  Nicholson-Springer 10/21/2021, 4:59 PM

## 2021-10-22 ENCOUNTER — Other Ambulatory Visit (HOSPITAL_COMMUNITY): Payer: Self-pay

## 2021-10-22 LAB — GLUCOSE, CAPILLARY: Glucose-Capillary: 69 mg/dL — ABNORMAL LOW (ref 70–99)

## 2021-10-22 MED ORDER — ACETAMINOPHEN 325 MG PO TABS
650.0000 mg | ORAL_TABLET | ORAL | 0 refills | Status: DC | PRN
  Filled 2021-10-22: qty 30, 3d supply, fill #0

## 2021-10-22 MED ORDER — SENNOSIDES-DOCUSATE SODIUM 8.6-50 MG PO TABS
2.0000 | ORAL_TABLET | Freq: Every evening | ORAL | 0 refills | Status: AC | PRN
  Filled 2021-10-22: qty 60, 30d supply, fill #0

## 2021-10-22 MED ORDER — FERROUS SULFATE 325 (65 FE) MG PO TABS
325.0000 mg | ORAL_TABLET | ORAL | 0 refills | Status: AC
Start: 2021-10-22 — End: 2022-01-20
  Filled 2021-10-22: qty 45, 90d supply, fill #0

## 2021-10-22 MED ORDER — SIMETHICONE 80 MG PO CHEW
80.0000 mg | CHEWABLE_TABLET | ORAL | 0 refills | Status: AC | PRN
  Filled 2021-10-22: qty 30, 7d supply, fill #0

## 2021-10-22 MED ORDER — IBUPROFEN 600 MG PO TABS
600.0000 mg | ORAL_TABLET | Freq: Four times a day (QID) | ORAL | 0 refills | Status: DC
  Filled 2021-10-22: qty 30, 8d supply, fill #0

## 2021-10-22 NOTE — Progress Notes (Signed)
Pt states she has bp home device that works and she knows where it is at home. Initial bp taken with hospital dynamap

## 2021-10-23 ENCOUNTER — Telehealth: Payer: Self-pay | Admitting: Obstetrics and Gynecology

## 2021-10-23 DIAGNOSIS — O165 Unspecified maternal hypertension, complicating the puerperium: Secondary | ICD-10-CM

## 2021-10-23 MED ORDER — NIFEDIPINE ER OSMOTIC RELEASE 30 MG PO TB24: 30.0000 mg | ORAL_TABLET | Freq: Every day | ORAL | 0 refills | Status: AC

## 2021-10-23 NOTE — Telephone Encounter (Signed)
OB Telephone Note I was called by [employer] re: a BP of 182/85. Patient called and she states she meant to put 142 and not 182. She states she feels fine and was told to check it twice a day; earlier in the day it was 130s/70s.  She is asymptomatic. I told her I recommend starting BP medications and she is amenable to this and is able to pick it up tonight. Procardia xl 30 qday sent in. Message sent to clinic about bp check next week. ED precautions given  Cornelia Copa MD Attending Center for [facility] (Faculty Practice) 10/23/2021 Time: 2335

## 2021-10-27 ENCOUNTER — Ambulatory Visit (INDEPENDENT_AMBULATORY_CARE_PROVIDER_SITE_OTHER): Payer: Medicaid Other | Admitting: *Deleted

## 2021-10-27 ENCOUNTER — Other Ambulatory Visit: Payer: Self-pay

## 2021-10-27 VITALS — BP 130/81 | HR 118

## 2021-10-27 DIAGNOSIS — O165 Unspecified maternal hypertension, complicating the puerperium: Secondary | ICD-10-CM

## 2021-10-27 NOTE — Progress Notes (Addendum)
Subjective:  Kristin Rios is a 20 y.o. female here for BP check.   Hypertension ROS: taking medications as instructed, no medication side effects noted, no TIA's, no chest pain on exertion, no dyspnea on exertion, and no swelling of ankles.    Objective:  LMP 01/25/2021   Appearance alert, well appearing, and in no distress and oriented to person, place, and time. General exam BP noted to be well controlled today in office.    Assessment:   Blood Pressure well controlled.   Plan:  Current treatment plan is effective, no change in therapy.  Continue Procardia 30 mg XL. Follow up as scheduled on 12/02/21 for 6 week postpartum check.   Patient was assessed and managed by nursing staff during this encounter. I have reviewed the chart and agree with the documentation and plan. I have also made any necessary editorial changes.  Coral Ceo, MD 11/03/2021 2:53 PM

## 2021-10-28 ENCOUNTER — Telehealth: Payer: Self-pay | Admitting: Family Medicine

## 2021-10-28 NOTE — Telephone Encounter (Signed)
Discussed BP up last night at 167/98, this am 152/80--will increase procardia to 30 XL BID. Warning signs reviewed. Has occasional headaches, but nothing persistent.

## 2021-10-31 ENCOUNTER — Telehealth (HOSPITAL_COMMUNITY): Payer: Self-pay | Admitting: *Deleted

## 2021-10-31 NOTE — Telephone Encounter (Signed)
Phone voicemail message left to return nurse call.  Duffy Rhody, RN 10-31-2021 at 12:43pm

## 2021-11-05 ENCOUNTER — Encounter: Payer: Self-pay | Admitting: Family Medicine

## 2021-12-02 ENCOUNTER — Other Ambulatory Visit: Payer: Medicaid Other

## 2021-12-02 ENCOUNTER — Ambulatory Visit: Payer: Medicaid Other | Admitting: Obstetrics and Gynecology

## 2021-12-02 ENCOUNTER — Other Ambulatory Visit: Payer: Self-pay

## 2021-12-09 ENCOUNTER — Other Ambulatory Visit: Payer: Medicaid Other

## 2021-12-25 ENCOUNTER — Ambulatory Visit: Payer: Medicaid Other | Admitting: Obstetrics

## 2021-12-26 ENCOUNTER — Other Ambulatory Visit (HOSPITAL_COMMUNITY): Payer: Self-pay

## 2022-02-03 ENCOUNTER — Observation Stay (HOSPITAL_COMMUNITY)
Admission: EM | Admit: 2022-02-03 | Discharge: 2022-02-05 | Disposition: A | Discharge status: Home / Self Care | Payer: Medicaid Other | Attending: General Surgery | Admitting: General Surgery

## 2022-02-03 ENCOUNTER — Ambulatory Visit: Payer: Medicaid Other | Admitting: Obstetrics

## 2022-02-03 ENCOUNTER — Encounter (HOSPITAL_COMMUNITY): Payer: Self-pay

## 2022-02-03 ENCOUNTER — Other Ambulatory Visit: Payer: Self-pay

## 2022-02-03 DIAGNOSIS — K810 Acute cholecystitis: Secondary | ICD-10-CM | POA: Diagnosis present

## 2022-02-03 DIAGNOSIS — Z20822 Contact with and (suspected) exposure to covid-19: Secondary | ICD-10-CM | POA: Diagnosis not present

## 2022-02-03 DIAGNOSIS — K8000 Calculus of gallbladder with acute cholecystitis without obstruction: Secondary | ICD-10-CM | POA: Diagnosis present

## 2022-02-03 LAB — I-STAT BETA HCG BLOOD, ED (MC, WL, AP ONLY): I-stat hCG, quantitative: <5 m[IU]/mL (ref <5)

## 2022-02-03 LAB — COMPREHENSIVE METABOLIC PANEL
ALT: 40 U/L (ref 0–44)
AST: 23 U/L (ref 15–41)
Albumin: 3.9 g/dL (ref 3.5–5.0)
Alkaline Phosphatase: 85 U/L (ref 38–126)
Anion gap: 8 (ref 5–15)
BUN: 6 mg/dL (ref 6–20)
CO2: 27 mmol/L (ref 22–32)
Calcium: 9.2 mg/dL (ref 8.9–10.3)
Chloride: 104 mmol/L (ref 98–111)
Creatinine, Ser: 0.53 mg/dL (ref 0.44–1.00)
GFR, Estimated: >60 mL/min (ref >60)
Glucose, Bld: 122 mg/dL — ABNORMAL HIGH (ref 70–99)
Potassium: 3.6 mmol/L (ref 3.5–5.1)
Sodium: 139 mmol/L (ref 135–145)
Total Bilirubin: 0.3 mg/dL (ref 0.3–1.2)
Total Protein: 7.9 g/dL (ref 6.5–8.1)

## 2022-02-03 LAB — CBC
HCT: 41 % (ref 36.0–46.0)
Hemoglobin: 12.6 g/dL (ref 12.0–15.0)
MCH: 25.3 pg — ABNORMAL LOW (ref 26.0–34.0)
MCHC: 30.7 g/dL (ref 30.0–36.0)
MCV: 82.2 fL (ref 80.0–100.0)
Platelets: 335 10*3/uL (ref 150–400)
RBC: 4.99 MIL/uL (ref 3.87–5.11)
RDW: 14.8 % (ref 11.5–15.5)
WBC: 10 10*3/uL (ref 4.0–10.5)
nRBC: 0 % (ref 0.0–0.2)

## 2022-02-03 LAB — URINALYSIS, ROUTINE W REFLEX MICROSCOPIC
Bacteria, UA: NONE SEEN
Bilirubin Urine: NEGATIVE
Glucose, UA: NEGATIVE mg/dL
Hgb urine dipstick: NEGATIVE
Ketones, ur: NEGATIVE mg/dL
Leukocytes,Ua: NEGATIVE
Nitrite: NEGATIVE
Protein, ur: 100 mg/dL — AB
Specific Gravity, Urine: 1.028 (ref 1.005–1.030)
pH: 5 (ref 5.0–8.0)

## 2022-02-03 LAB — LIPASE, BLOOD: Lipase: 26 U/L (ref 11–51)

## 2022-02-03 NOTE — ED Triage Notes (Signed)
Pt presents to the ED from home with complaints of epigastric pain, N/V. Pt states this has happened once before but returned.

## 2022-02-03 NOTE — ED Provider Triage Note (Signed)
Emergency Medicine Provider Triage Evaluation Note  Kristin Rios , a 21 y.o. female  was evaluated in triage.  Pt complains of nausea and epigastric pain since Friday.  No diarrhea, fever, chills.  No urinary complaints.  Review of Systems  Positive:  Negative: See above   Physical Exam  BP (!) 141/80    Pulse 97    Temp 98.3 F (36.8 C) (Oral)    Resp 16    Ht 5\' 5"  (1.651 m)    Wt 117.9 kg    LMP 12/26/2021    SpO2 99%    BMI 43.27 kg/m  Gen:   Awake, no distress   Resp:  Normal effort  MSK:   Moves extremities without difficulty  Other:  Mild epigastric tenderness  Medical Decision Making  Medically screening exam initiated at 8:47 PM.  Appropriate orders placed.  Deleah Tison was informed that the remainder of the evaluation will be completed by another provider, this initial triage assessment does not replace that evaluation, and the importance of remaining in the ED until their evaluation is complete.  Had a first episode of vomiting in the department   Doreene Eland, PA-C 02/03/22 2049

## 2022-02-04 ENCOUNTER — Encounter (HOSPITAL_COMMUNITY)
Admission: EM | Disposition: A | Discharge status: Home / Self Care | Payer: Self-pay | Source: Home / Self Care | Attending: Emergency Medicine

## 2022-02-04 ENCOUNTER — Encounter (HOSPITAL_COMMUNITY): Payer: Self-pay

## 2022-02-04 ENCOUNTER — Emergency Department (HOSPITAL_BASED_OUTPATIENT_CLINIC_OR_DEPARTMENT_OTHER): Payer: Medicaid Other | Admitting: Anesthesiology

## 2022-02-04 ENCOUNTER — Emergency Department (HOSPITAL_COMMUNITY): Payer: Medicaid Other | Admitting: Anesthesiology

## 2022-02-04 ENCOUNTER — Emergency Department (HOSPITAL_COMMUNITY): Payer: Medicaid Other

## 2022-02-04 ENCOUNTER — Other Ambulatory Visit: Payer: Self-pay

## 2022-02-04 DIAGNOSIS — K810 Acute cholecystitis: Secondary | ICD-10-CM

## 2022-02-04 DIAGNOSIS — I10 Essential (primary) hypertension: Secondary | ICD-10-CM

## 2022-02-04 DIAGNOSIS — E119 Type 2 diabetes mellitus without complications: Secondary | ICD-10-CM | POA: Diagnosis not present

## 2022-02-04 HISTORY — PX: CHOLECYSTECTOMY: SHX55

## 2022-02-04 LAB — RESP PANEL BY RT-PCR (FLU A&B, COVID) ARPGX2
Influenza A by PCR: NEGATIVE
Influenza B by PCR: NEGATIVE
SARS Coronavirus 2 by RT PCR: NEGATIVE

## 2022-02-04 SURGERY — LAPAROSCOPIC CHOLECYSTECTOMY
Anesthesia: General | Site: Abdomen

## 2022-02-04 MED ORDER — OXYCODONE HCL 5 MG/5ML PO SOLN: 5.0000 mg | Freq: Once | ORAL | Status: AC | PRN

## 2022-02-04 MED ORDER — KCL IN DEXTROSE-NACL 20-5-0.45 MEQ/L-%-% IV SOLN: INTRAVENOUS | Status: DC

## 2022-02-04 MED ORDER — ONDANSETRON 4 MG PO TBDP: 4.0000 mg | ORAL_TABLET | Freq: Four times a day (QID) | ORAL | Status: DC | PRN

## 2022-02-04 MED ORDER — LIDOCAINE VISCOUS HCL 2 % MT SOLN
15.0000 mL | Freq: Once | OROMUCOSAL | Status: AC
  Administered 2022-02-04: 15 mL via ORAL
  Filled 2022-02-04: qty 15

## 2022-02-04 MED ORDER — DIPHENHYDRAMINE HCL 25 MG PO CAPS: 25.0000 mg | ORAL_CAPSULE | Freq: Four times a day (QID) | ORAL | Status: DC | PRN

## 2022-02-04 MED ORDER — ONDANSETRON HCL 4 MG/2ML IJ SOLN
4.0000 mg | Freq: Once | INTRAMUSCULAR | Status: AC
Start: 2022-02-04 — End: 2022-02-04
  Administered 2022-02-04: 4 mg via INTRAVENOUS
  Filled 2022-02-04: qty 2

## 2022-02-04 MED ORDER — ALUM & MAG HYDROXIDE-SIMETH 200-200-20 MG/5ML PO SUSP
30.0000 mL | Freq: Once | ORAL | Status: AC
  Administered 2022-02-04: 30 mL via ORAL
  Filled 2022-02-04: qty 30

## 2022-02-04 MED ORDER — SCOPOLAMINE 1 MG/3DAYS TD PT72
1.0000 | MEDICATED_PATCH | TRANSDERMAL | Status: DC
  Administered 2022-02-04: 1.5 mg via TRANSDERMAL

## 2022-02-04 MED ORDER — DEXAMETHASONE SODIUM PHOSPHATE 10 MG/ML IJ SOLN
INTRAMUSCULAR | Status: DC | PRN
  Administered 2022-02-04: 10 mg via INTRAVENOUS

## 2022-02-04 MED ORDER — SENNOSIDES-DOCUSATE SODIUM 8.6-50 MG PO TABS: 2.0000 | ORAL_TABLET | Freq: Every evening | ORAL | Status: DC | PRN

## 2022-02-04 MED ORDER — ONDANSETRON HCL 4 MG/2ML IJ SOLN
INTRAMUSCULAR | Status: DC | PRN
Start: 2022-02-04 — End: 2022-02-04
  Administered 2022-02-04: 4 mg via INTRAVENOUS

## 2022-02-04 MED ORDER — ENOXAPARIN SODIUM 40 MG/0.4ML IJ SOSY
40.0000 mg | PREFILLED_SYRINGE | INTRAMUSCULAR | Status: DC
  Administered 2022-02-05: 40 mg via SUBCUTANEOUS
  Filled 2022-02-04: qty 0.4

## 2022-02-04 MED ORDER — METOPROLOL TARTRATE 5 MG/5ML IV SOLN
5.0000 mg | Freq: Four times a day (QID) | INTRAVENOUS | Status: DC | PRN
  Filled 2022-02-04: qty 5

## 2022-02-04 MED ORDER — OXYCODONE HCL 5 MG PO TABS
5.0000 mg | ORAL_TABLET | Freq: Once | ORAL | Status: AC | PRN
  Administered 2022-02-04: 5 mg via ORAL

## 2022-02-04 MED ORDER — SUGAMMADEX SODIUM 200 MG/2ML IV SOLN
INTRAVENOUS | Status: DC | PRN
  Administered 2022-02-04: 200 mg via INTRAVENOUS
  Administered 2022-02-04: 50 mg via INTRAVENOUS

## 2022-02-04 MED ORDER — DIPHENHYDRAMINE HCL 50 MG/ML IJ SOLN: 25.0000 mg | Freq: Four times a day (QID) | INTRAMUSCULAR | Status: DC | PRN

## 2022-02-04 MED ORDER — FENTANYL CITRATE (PF) 250 MCG/5ML IJ SOLN
INTRAMUSCULAR | Status: DC | PRN
Start: 2022-02-04 — End: 2022-02-04
  Administered 2022-02-04 (×5): 50 ug via INTRAVENOUS

## 2022-02-04 MED ORDER — FENTANYL CITRATE (PF) 250 MCG/5ML IJ SOLN
INTRAMUSCULAR | Status: AC
  Filled 2022-02-04: qty 5

## 2022-02-04 MED ORDER — HEMOSTATIC AGENTS (NO CHARGE) OPTIME
TOPICAL | Status: DC | PRN
  Administered 2022-02-04: 1 via TOPICAL

## 2022-02-04 MED ORDER — NIFEDIPINE ER OSMOTIC RELEASE 30 MG PO TB24
30.0000 mg | ORAL_TABLET | Freq: Every day | ORAL | Status: DC
  Administered 2022-02-04: 30 mg via ORAL
  Filled 2022-02-04 (×2): qty 1

## 2022-02-04 MED ORDER — CHLORHEXIDINE GLUCONATE 0.12 % MT SOLN
OROMUCOSAL | Status: AC
  Filled 2022-02-04: qty 15

## 2022-02-04 MED ORDER — MORPHINE SULFATE (PF) 4 MG/ML IV SOLN
4.0000 mg | Freq: Once | INTRAVENOUS | Status: AC
  Administered 2022-02-04: 4 mg via INTRAVENOUS
  Filled 2022-02-04: qty 1

## 2022-02-04 MED ORDER — PRENATAL MULTIVITAMIN CH
1.0000 | ORAL_TABLET | Freq: Every day | ORAL | Status: DC
  Filled 2022-02-04: qty 1

## 2022-02-04 MED ORDER — SIMETHICONE 80 MG PO CHEW
80.0000 mg | CHEWABLE_TABLET | ORAL | Status: DC | PRN
  Filled 2022-02-04: qty 1

## 2022-02-04 MED ORDER — ONDANSETRON HCL 4 MG/2ML IJ SOLN
INTRAMUSCULAR | Status: AC
  Filled 2022-02-04: qty 2

## 2022-02-04 MED ORDER — PROPOFOL 10 MG/ML IV BOLUS
INTRAVENOUS | Status: AC
  Filled 2022-02-04: qty 20

## 2022-02-04 MED ORDER — OXYCODONE HCL 5 MG PO TABS
5.0000 mg | ORAL_TABLET | ORAL | Status: DC | PRN
  Administered 2022-02-05 (×2): 5 mg via ORAL
  Filled 2022-02-04 (×2): qty 1

## 2022-02-04 MED ORDER — ONDANSETRON HCL 4 MG/2ML IJ SOLN: 4.0000 mg | Freq: Four times a day (QID) | INTRAMUSCULAR | Status: DC | PRN

## 2022-02-04 MED ORDER — ROCURONIUM BROMIDE 10 MG/ML (PF) SYRINGE
PREFILLED_SYRINGE | INTRAVENOUS | Status: AC
  Filled 2022-02-04: qty 10

## 2022-02-04 MED ORDER — ACETAMINOPHEN 500 MG PO TABS
ORAL_TABLET | ORAL | Status: AC
  Filled 2022-02-04: qty 2

## 2022-02-04 MED ORDER — FENTANYL CITRATE (PF) 100 MCG/2ML IJ SOLN
INTRAMUSCULAR | Status: AC
  Filled 2022-02-04: qty 2

## 2022-02-04 MED ORDER — IOHEXOL 300 MG/ML  SOLN
100.0000 mL | Freq: Once | INTRAMUSCULAR | Status: AC | PRN
  Administered 2022-02-04: 100 mL via INTRAVENOUS

## 2022-02-04 MED ORDER — AMISULPRIDE (ANTIEMETIC) 5 MG/2ML IV SOLN: 10.0000 mg | Freq: Once | INTRAVENOUS | Status: DC | PRN

## 2022-02-04 MED ORDER — ACETAMINOPHEN 500 MG PO TABS
1000.0000 mg | ORAL_TABLET | Freq: Four times a day (QID) | ORAL | Status: DC
  Administered 2022-02-04 – 2022-02-05 (×3): 1000 mg via ORAL
  Filled 2022-02-04 (×3): qty 2

## 2022-02-04 MED ORDER — MORPHINE SULFATE (PF) 4 MG/ML IV SOLN: 4.0000 mg | Freq: Once | INTRAVENOUS | Status: DC

## 2022-02-04 MED ORDER — POLYETHYLENE GLYCOL 3350 17 G PO PACK: 17.0000 g | PACK | Freq: Every day | ORAL | Status: DC | PRN

## 2022-02-04 MED ORDER — MORPHINE SULFATE (PF) 2 MG/ML IV SOLN: 2.0000 mg | INTRAVENOUS | Status: DC | PRN

## 2022-02-04 MED ORDER — SCOPOLAMINE 1 MG/3DAYS TD PT72
MEDICATED_PATCH | TRANSDERMAL | Status: AC
  Filled 2022-02-04: qty 1

## 2022-02-04 MED ORDER — MIDAZOLAM HCL 2 MG/2ML IJ SOLN
INTRAMUSCULAR | Status: AC
  Filled 2022-02-04: qty 2

## 2022-02-04 MED ORDER — LIDOCAINE 2% (20 MG/ML) 5 ML SYRINGE
INTRAMUSCULAR | Status: AC
  Filled 2022-02-04: qty 5

## 2022-02-04 MED ORDER — FERROUS SULFATE 325 (65 FE) MG PO TABS
325.0000 mg | ORAL_TABLET | ORAL | Status: DC
Start: 2022-02-05 — End: 2022-02-05

## 2022-02-04 MED ORDER — BUPIVACAINE HCL (PF) 0.25 % IJ SOLN
INTRAMUSCULAR | Status: AC
  Filled 2022-02-04: qty 30

## 2022-02-04 MED ORDER — OXYCODONE HCL 5 MG PO TABS
ORAL_TABLET | ORAL | Status: AC
  Filled 2022-02-04: qty 1

## 2022-02-04 MED ORDER — LIDOCAINE 2% (20 MG/ML) 5 ML SYRINGE
INTRAMUSCULAR | Status: DC | PRN
  Administered 2022-02-04: 80 mg via INTRAVENOUS

## 2022-02-04 MED ORDER — FENTANYL CITRATE (PF) 100 MCG/2ML IJ SOLN: 25.0000 ug | INTRAMUSCULAR | Status: DC | PRN

## 2022-02-04 MED ORDER — ROCURONIUM BROMIDE 10 MG/ML (PF) SYRINGE
PREFILLED_SYRINGE | INTRAVENOUS | Status: DC | PRN
  Administered 2022-02-04: 80 mg via INTRAVENOUS

## 2022-02-04 MED ORDER — SODIUM CHLORIDE 0.9 % IV BOLUS
1000.0000 mL | Freq: Once | INTRAVENOUS | Status: AC
  Administered 2022-02-04: 1000 mL via INTRAVENOUS

## 2022-02-04 MED ORDER — FAMOTIDINE 20 MG PO TABS
20.0000 mg | ORAL_TABLET | Freq: Once | ORAL | Status: AC
Start: 2022-02-04 — End: 2022-02-04
  Administered 2022-02-04: 20 mg via ORAL
  Filled 2022-02-04: qty 1

## 2022-02-04 MED ORDER — ONDANSETRON HCL 4 MG/2ML IJ SOLN: 4.0000 mg | Freq: Once | INTRAMUSCULAR | Status: DC | PRN

## 2022-02-04 MED ORDER — MIDAZOLAM HCL 2 MG/2ML IJ SOLN
INTRAMUSCULAR | Status: DC | PRN
  Administered 2022-02-04: 2 mg via INTRAVENOUS

## 2022-02-04 MED ORDER — LACTATED RINGERS IV SOLN: INTRAVENOUS | Status: DC

## 2022-02-04 MED ORDER — INDOCYANINE GREEN 25 MG IV SOLR
INTRAVENOUS | Status: DC | PRN
  Administered 2022-02-04: 2.5 mg via INTRAVENOUS

## 2022-02-04 MED ORDER — SODIUM CHLORIDE 0.9 % IV SOLN
2.0000 g | Freq: Once | INTRAVENOUS | Status: AC
  Administered 2022-02-04: 2 g via INTRAVENOUS
  Filled 2022-02-04: qty 20

## 2022-02-04 MED ORDER — ACETAMINOPHEN 500 MG PO TABS
1000.0000 mg | ORAL_TABLET | Freq: Once | ORAL | Status: AC
  Administered 2022-02-04: 1000 mg via ORAL

## 2022-02-04 MED ORDER — DEXAMETHASONE SODIUM PHOSPHATE 10 MG/ML IJ SOLN
INTRAMUSCULAR | Status: AC
  Filled 2022-02-04: qty 1

## 2022-02-04 MED ORDER — SODIUM CHLORIDE 0.9 % IR SOLN
Status: DC | PRN
  Administered 2022-02-04: 1000 mL

## 2022-02-04 MED ORDER — 0.9 % SODIUM CHLORIDE (POUR BTL) OPTIME
TOPICAL | Status: DC | PRN
  Administered 2022-02-04: 1000 mL

## 2022-02-04 MED ORDER — BUPIVACAINE HCL 0.25 % IJ SOLN
INTRAMUSCULAR | Status: DC | PRN
  Administered 2022-02-04: 12 mL

## 2022-02-04 MED ORDER — PROPOFOL 10 MG/ML IV BOLUS
INTRAVENOUS | Status: DC | PRN
  Administered 2022-02-04: 150 mg via INTRAVENOUS

## 2022-02-04 SURGICAL SUPPLY — 41 items
BAG COUNTER SPONGE SURGICOUNT (BAG) ×2 IMPLANT
CANISTER SUCT 3000ML PPV (MISCELLANEOUS) ×2 IMPLANT
CHLORAPREP W/TINT 26 (MISCELLANEOUS) ×2 IMPLANT
CLIP LIGATING HEMO O LOK GREEN (MISCELLANEOUS) ×3 IMPLANT
COVER SURGICAL LIGHT HANDLE (MISCELLANEOUS) ×2 IMPLANT
COVER TRANSDUCER ULTRASND (DRAPES) ×2 IMPLANT
DERMABOND ADVANCED (GAUZE/BANDAGES/DRESSINGS) ×1
DERMABOND ADVANCED .7 DNX12 (GAUZE/BANDAGES/DRESSINGS) ×1 IMPLANT
ELECT REM PT RETURN 9FT ADLT (ELECTROSURGICAL) ×2
ELECTRODE REM PT RTRN 9FT ADLT (ELECTROSURGICAL) ×1 IMPLANT
GLOVE SURG SYN 7.5  E (GLOVE) ×1
GLOVE SURG SYN 7.5 E (GLOVE) ×1 IMPLANT
GLOVE SURG SYN 7.5 PF PI (GLOVE) ×1 IMPLANT
GOWN STRL REUS W/ TWL LRG LVL3 (GOWN DISPOSABLE) ×2 IMPLANT
GOWN STRL REUS W/ TWL XL LVL3 (GOWN DISPOSABLE) ×1 IMPLANT
GOWN STRL REUS W/TWL LRG LVL3 (GOWN DISPOSABLE) ×2
GOWN STRL REUS W/TWL XL LVL3 (GOWN DISPOSABLE) ×1
GRASPER SUT TROCAR 14GX15 (MISCELLANEOUS) ×2 IMPLANT
HEMOSTAT SNOW SURGICEL 2X4 (HEMOSTASIS) ×1 IMPLANT
KIT BASIN OR (CUSTOM PROCEDURE TRAY) ×2 IMPLANT
KIT TURNOVER KIT B (KITS) ×2 IMPLANT
NDL INSUFFLATION 14GA 120MM (NEEDLE) ×1 IMPLANT
NEEDLE INSUFFLATION 14GA 120MM (NEEDLE) ×2 IMPLANT
NS IRRIG 1000ML POUR BTL (IV SOLUTION) ×2 IMPLANT
PAD ARMBOARD 7.5X6 YLW CONV (MISCELLANEOUS) ×2 IMPLANT
POUCH LAPAROSCOPIC INSTRUMENT (MISCELLANEOUS) ×2 IMPLANT
POUCH RETRIEVAL ECOSAC 10 (ENDOMECHANICALS) IMPLANT
POUCH RETRIEVAL ECOSAC 10MM (ENDOMECHANICALS) ×1
SCISSORS LAP 5X35 DISP (ENDOMECHANICALS) ×2 IMPLANT
SET IRRIG TUBING LAPAROSCOPIC (IRRIGATION / IRRIGATOR) ×2 IMPLANT
SET TUBE SMOKE EVAC HIGH FLOW (TUBING) ×2 IMPLANT
SLEEVE ENDOPATH XCEL 5M (ENDOMECHANICALS) ×3 IMPLANT
SPECIMEN JAR SMALL (MISCELLANEOUS) ×2 IMPLANT
SUT MNCRL AB 4-0 PS2 18 (SUTURE) ×2 IMPLANT
TOWEL GREEN STERILE (TOWEL DISPOSABLE) ×1 IMPLANT
TOWEL GREEN STERILE FF (TOWEL DISPOSABLE) ×2 IMPLANT
TRAY LAPAROSCOPIC MC (CUSTOM PROCEDURE TRAY) ×2 IMPLANT
TROCAR XCEL NON-BLD 11X100MML (ENDOMECHANICALS) ×2 IMPLANT
TROCAR XCEL NON-BLD 5MMX100MML (ENDOMECHANICALS) ×2 IMPLANT
WARMER LAPAROSCOPE (MISCELLANEOUS) ×2 IMPLANT
WATER STERILE IRR 1000ML POUR (IV SOLUTION) ×2 IMPLANT

## 2022-02-04 NOTE — Anesthesia Procedure Notes (Signed)
Procedure Name: Intubation ?Date/Time: 02/04/2022 11:25 AM ?Performed by: Elliot Dally, CRNA ?Pre-anesthesia Checklist: Patient identified, Emergency Drugs available, Suction available and Patient being monitored ?Patient Re-evaluated:Patient Re-evaluated prior to induction ?Oxygen Delivery Method: Circle System Utilized ?Preoxygenation: Pre-oxygenation with 100% oxygen ?Induction Type: IV induction ?Ventilation: Mask ventilation without difficulty ?Laryngoscope Size: 3 and Miller ?Grade View: Grade I ?Tube type: Oral ?Tube size: 7.0 mm ?Number of attempts: 1 ?Airway Equipment and Method: Stylet and Oral airway ?Placement Confirmation: ETT inserted through vocal cords under direct vision, positive ETCO2 and breath sounds checked- equal and bilateral ?Secured at: 22 cm ?Tube secured with: Tape ?Dental Injury: Teeth and Oropharynx as per pre-operative assessment  ? ? ? ? ?

## 2022-02-04 NOTE — ED Notes (Signed)
Patient transported to CT 

## 2022-02-04 NOTE — ED Provider Notes (Signed)
[facility] Provider Note   CSN: [id number] Arrival date & time: 02/03/22  1958     History  Chief Complaint  Patient presents with   Abdominal Pain    Kristin Rios is a 21 y.o. female.  21 year old female presents today for evaluation of abdominal pain of 1.5 months duration that significantly worsened yesterday prior to arrival.  Patient reports after onset of abdominal pain 1.5 months ago pain spontaneously improved after short while and returned Sunday where again it was intermittent with associated nausea however yesterday pain significantly worsened and remained constant and while patient was in the lobby she had an episode of emesis.  Emesis is nonbilious and nonbloody.  Denies fever, palpitations, lightheadedness, radiation of the abdominal pain, dysuria, vaginal discharge, constipation, diarrhea, blood in stool.  She has not tried anything prior to arrival.  The history is provided by the patient. No language interpreter was used.      Home Medications Prior to Admission medications   Medication Sig Start Date End Date Taking? Authorizing Provider  acetaminophen (TYLENOL) 325 MG tablet Take 2 tablets (650 mg total) by mouth every 4 (four) hours as needed (for pain scale < 4). 10/22/21   Araceli Bouche, MD  Blood Pressure Monitoring (BLOOD PRESSURE KIT) DEVI 1 kit by Does not apply route once a week. 04/07/21   Constant, Peggy, MD  ferrous sulfate 325 (65 FE) MG tablet Take 1 tablet (325 mg total) by mouth every other day. 10/22/21 01/20/22  Araceli Bouche, MD  ibuprofen (ADVIL) 600 MG tablet Take 1 tablet (600 mg total) by mouth every 6 (six) hours. 10/22/21   Araceli Bouche, MD  NIFEdipine (PROCARDIA-XL/NIFEDICAL-XL) 30 MG 24 hr tablet Take 1 tablet (30 mg total) by mouth daily. 10/23/21   Aletha Halim, MD  Prenatal Vit-Fe Fumarate-FA (PREPLUS) 27-1 MG TABS Take 1 tablet by mouth daily. 05/13/21   Shelly Bombard, MD  senna-docusate  (SENOKOT-S) 8.6-50 MG tablet Take 2 tablets by mouth at bedtime as needed for mild constipation. 10/22/21   Araceli Bouche, MD  simethicone (MYLICON) 80 MG chewable tablet Chew 1 tablet (80 mg total) by mouth as needed for flatulence. 10/22/21   Araceli Bouche, MD      Allergies    Patient has no known allergies.    Review of Systems   Review of Systems  Constitutional:  Negative for activity change, chills and fever.  Respiratory:  Negative for shortness of breath.   Cardiovascular:  Negative for chest pain and palpitations.  Gastrointestinal:  Positive for abdominal pain, nausea and vomiting. Negative for blood in stool, constipation and diarrhea.  Genitourinary:  Negative for dysuria, vaginal bleeding and vaginal discharge.  Neurological:  Negative for weakness and light-headedness.  All other systems reviewed and are negative.  Physical Exam Updated Vital Signs BP (!) 127/93 (BP Location: Left Arm)    Pulse (!) 111    Temp 98.9 F (37.2 C) (Oral)    Resp 17    Ht 5' 5"  (1.651 m)    Wt 117.9 kg    LMP 12/26/2021    SpO2 100%    BMI 43.27 kg/m  Physical Exam Vitals and nursing note reviewed.  Constitutional:      General: She is not in acute distress.    Appearance: Normal appearance. She is not ill-appearing.  HENT:     Head: Normocephalic and atraumatic.     Nose: Nose normal.  Eyes:     Conjunctiva/sclera: Conjunctivae normal.  Cardiovascular:     Rate and Rhythm: Regular rhythm. Tachycardia present.  Pulmonary:     Effort: Pulmonary effort is normal. No respiratory distress.     Breath sounds: No wheezing or rales.  Abdominal:     General: There is no distension.     Tenderness: There is no abdominal tenderness. There is no right CVA tenderness, left CVA tenderness or guarding.  Musculoskeletal:        General: No deformity.  Skin:    Findings: No rash.  Neurological:     Mental Status: She is alert.    ED Results / Procedures / Treatments   Labs (all labs  ordered are listed, but only abnormal results are displayed) Labs Reviewed  COMPREHENSIVE METABOLIC PANEL - Abnormal; Notable for the following components:      Result Value   Glucose, Bld 122 (*)    All other components within normal limits  CBC - Abnormal; Notable for the following components:   MCH 25.3 (*)    All other components within normal limits  URINALYSIS, ROUTINE W REFLEX MICROSCOPIC - Abnormal; Notable for the following components:   APPearance HAZY (*)    Protein, ur 100 (*)    All other components within normal limits  LIPASE, BLOOD  I-STAT BETA HCG BLOOD, ED ([facility], [facility], [facility] ONLY)    EKG None  Radiology No results found.  Procedures Procedures    Medications Ordered in ED Medications  sodium chloride 0.9 % bolus 1,000 mL (has no administration in time range)  ondansetron (ZOFRAN) injection 4 mg (has no administration in time range)  morphine (PF) 4 MG/ML injection 4 mg (has no administration in time range)  alum & mag hydroxide-simeth (MAALOX/MYLANTA) 200-200-20 MG/5ML suspension 30 mL (30 mLs Oral Given 02/04/22 0041)    And  lidocaine (XYLOCAINE) 2 % viscous mouth solution 15 mL (15 mLs Oral Given 02/04/22 0041)  famotidine (PEPCID) tablet 20 mg (20 mg Oral Given 02/04/22 0041)    ED Course/ Medical Decision Making/ A&P Clinical Course as of 02/04/22 0929  Wed Feb 04, 2022  0925 CT abdomen pelvis with evidence of acute cholecystitis.  Will give dose of Rocephin and consult general surgeon.  Patient reports resolution of pain following dose of morphine. [AA]    Clinical Course User Index [AA] Evlyn Courier, PA-C                           Medical Decision Making Amount and/or Complexity of Data Reviewed Radiology: ordered.  Risk Prescription drug management.   Medical Decision Making / ED Course   This patient presents to the ED for concern of abdominal pain, this involves an extensive number of treatment options, and is a complaint that carries with it a  high risk of complications and morbidity.  The differential diagnosis includes pancreatitis, appendicitis, cholecystitis, diverticulitis, gastric ulcer, UTI, PID  MDM: 21 year old female presents today for evaluation of abdominal pain of 1 month of progressively worsening particularly yesterday with associated with constant abdominal pain, nausea, vomiting without associated fever, dysuria, vaginal discharge, pelvic pain.  CBC without leukocytosis or anemia.  CMP with glucose of 122 otherwise unremarkable.  Lipase within normal limits.  UA without UTI.  Will further evaluate with CT abdomen and pelvis with contrast.  Will provide Zofran, morphine, IV hydration.    Lab Tests: -I ordered, reviewed, and interpreted labs.   The pertinent results include:   Labs Reviewed  COMPREHENSIVE METABOLIC  PANEL - Abnormal; Notable for the following components:      Result Value   Glucose, Bld 122 (*)    All other components within normal limits  CBC - Abnormal; Notable for the following components:   MCH 25.3 (*)    All other components within normal limits  URINALYSIS, ROUTINE W REFLEX MICROSCOPIC - Abnormal; Notable for the following components:   APPearance HAZY (*)    Protein, ur 100 (*)    All other components within normal limits  LIPASE, BLOOD  I-STAT BETA HCG BLOOD, ED ([facility], [facility], [facility] ONLY)      EKG  EKG Interpretation  Date/Time:    Ventricular Rate:    PR Interval:    QRS Duration:   QT Interval:    QTC Calculation:   R Axis:     Text Interpretation:           Imaging Studies ordered: I ordered imaging studies including CT abdomen pelvis with contrast I independently visualized and interpreted imaging. I agree with the radiologist interpretation   Medicines ordered and prescription drug management: Meds ordered this encounter  Medications   AND Linked Order Group    alum & mag hydroxide-simeth (MAALOX/MYLANTA) 200-200-20 MG/5ML suspension 30 mL    lidocaine (XYLOCAINE) 2  % viscous mouth solution 15 mL   famotidine (PEPCID) tablet 20 mg   sodium chloride 0.9 % bolus 1,000 mL   ondansetron (ZOFRAN) injection 4 mg   morphine (PF) 4 MG/ML injection 4 mg    -I have reviewed the patients home medicines and have made adjustments as needed  Consultations Obtained: I requested consultation with the general surgery,  and discussed lab and imaging findings as well as pertinent plan - they recommend: They will evaluate patient for further management.   Cardiac Monitoring: The patient was maintained on a cardiac monitor.  I personally viewed and interpreted the cardiac monitored which showed an underlying rhythm of: Normal sinus rhythm  Reevaluation: After the interventions noted above, I reevaluated the patient and found that they have :improved  Co morbidities that complicate the patient evaluation  Past Medical History:  Diagnosis Date   Gestational diabetes    Medical history non-contributory       Dispostion: Patient discussed with general surgery who will evaluate patient for further management.   Final Clinical Impression(s) / ED Diagnoses Final diagnoses:  Acute cholecystitis    Rx / DC Orders ED Discharge Orders     None         Evlyn Courier, PA-C 02/04/22 0945    Isla Pence, MD 02/04/22 1049

## 2022-02-04 NOTE — H&P (Signed)
H&P Note  [facility] Rios 08/19/2001  [id number].    Requesting MD: Marita Kansas, PA-C Chief Complaint/Reason for Consult: Acute cholecystitis HPI:  Patient is a 21 year old female who presented to [facility] with abdominal pain. ~1.5 months ago she had intermittent, episodes of epigastric abdominal pain that would resolve a few hours after taking ibuprofen. Sunday her symptoms recurred but self resolved. Yesterday she had another attack of non-radiating epigastric abdominal pain, that did not resolve, was more severe (8/10) and now associated with intermittent nausea and emesis. Emesis was NBNB. Symptoms began after PO intake and were worsened with PO intake. She denied fever, chills, chest pain, SOB, urinary symptoms. Patient denies prior abdominal surgery. Recently delivered a baby vaginally in November of 2022. She is not breast feeding. No blood thinning medications. NKDA.   ROS: Review of Systems  Constitutional:  Negative for chills and fever.  Respiratory:  Negative for shortness of breath and wheezing.   Cardiovascular:  Negative for chest pain and palpitations.  Gastrointestinal:  Positive for abdominal pain, nausea and vomiting. Negative for blood in stool, constipation, diarrhea and melena.  Genitourinary:  Negative for dysuria, frequency and urgency.  Psychiatric/Behavioral:  Negative for substance abuse.   All other systems reviewed and are negative.  Family History  Problem Relation Age of Onset   Healthy Mother     Past Medical History:  Diagnosis Date   Gestational diabetes    Medical history non-contributory     Past Surgical History:  Procedure Laterality Date   WISDOM TOOTH EXTRACTION      Social History:  reports that she has never smoked. She has never used smokeless tobacco. She reports that she does not currently use alcohol. She reports that she does not currently use drugs. Denies tobacco, alcohol or ilicit drug use. Works at [employer].    Allergies: No Known Allergies  (Not in a hospital admission)   Blood pressure 133/63, pulse 93, temperature 98.9 F (37.2 C), temperature source Oral, resp. rate 14, height 5\' 5"  (1.651 m), weight 117.9 kg, last menstrual period 12/26/2021, SpO2 98 %, unknown if currently breastfeeding. Physical Exam:  General: pleasant, WD, overweight female who is laying in bed in NAD HEENT: head is normocephalic, atraumatic. Sclera are anicteric. Pupils equal and round.  Ears and nose without any masses or lesions.  Mouth is pink and moist Heart: regular, rate, and rhythm.  Normal s1,s2. No obvious murmurs, gallops, or rubs noted.  Palpable radial and pedal pulses bilaterally Lungs: CTAB, no wheezes, rhonchi, or rales noted.  Respiratory effort nonlabored Abd: soft, ttp in epigastrium > RUQ. + Murphy's sign. ND, +BS, no masses, hernias, or organomegaly MS: all 4 extremities are symmetrical with no cyanosis, clubbing, or edema. Skin: warm and dry with no masses, lesions, or rashes Neuro: Cranial nerves 2-12 grossly intact, sensation is normal throughout Psych: A&Ox3 with an appropriate affect.   Results for orders placed or performed during the hospital encounter of 02/03/22 (from the past 48 hour(s))  Urinalysis, Routine w reflex microscopic Urine, Clean Catch     Status: Abnormal   Collection Time: 02/03/22  8:48 PM  Result Value Ref Range   Color, Urine YELLOW YELLOW   APPearance HAZY (A) CLEAR   Specific Gravity, Urine 1.028 1.005 - 1.030   pH 5.0 5.0 - 8.0   Glucose, UA NEGATIVE NEGATIVE mg/dL   Hgb urine dipstick NEGATIVE NEGATIVE   Bilirubin Urine NEGATIVE NEGATIVE   Ketones, ur  NEGATIVE NEGATIVE mg/dL   Protein, ur 428 (A) NEGATIVE mg/dL   Nitrite NEGATIVE NEGATIVE   Leukocytes,Ua NEGATIVE NEGATIVE   RBC / HPF 0-5 0 - 5 RBC/hpf   WBC, UA 0-5 0 - 5 WBC/hpf   Bacteria, UA NONE SEEN NONE SEEN   Squamous Epithelial / LPF 0-5 0 - 5   Mucus PRESENT     Comment: Performed at  [facility], [address], [place], [place] [address]  Lipase, blood     Status: None   Collection Time: 02/03/22  8:54 PM  Result Value Ref Range   Lipase 26 11 - 51 U/L    Comment: Performed at [facility], [address], [place], [place] [address]  Comprehensive metabolic panel     Status: Abnormal   Collection Time: 02/03/22  8:54 PM  Result Value Ref Range   Sodium 139 135 - 145 mmol/L   Potassium 3.6 3.5 - 5.1 mmol/L   Chloride 104 98 - 111 mmol/L   CO2 27 22 - 32 mmol/L   Glucose, Bld 122 (H) 70 - 99 mg/dL    Comment: Glucose reference range applies only to samples taken after fasting for at least 8 hours.   BUN 6 6 - 20 mg/dL   Creatinine, Ser 0.35 0.44 - 1.00 mg/dL   Calcium 9.2 8.9 - 59.7 mg/dL   Total Protein 7.9 6.5 - 8.1 g/dL   Albumin 3.9 3.5 - 5.0 g/dL   AST 23 15 - 41 U/L   ALT 40 0 - 44 U/L   Alkaline Phosphatase 85 38 - 126 U/L   Total Bilirubin 0.3 0.3 - 1.2 mg/dL   GFR, Estimated >41 >63 mL/min    Comment: (NOTE) Calculated using the CKD-EPI Creatinine Equation (2021)    Anion gap 8 5 - 15    Comment: Performed at [facility], [address], [place], [place] [address]  CBC     Status: Abnormal   Collection Time: 02/03/22  8:54 PM  Result Value Ref Range   WBC 10.0 4.0 - 10.5 K/uL   RBC 4.99 3.87 - 5.11 MIL/uL   Hemoglobin 12.6 12.0 - 15.0 g/dL   HCT 46.8 03.2 - 12.2 %   MCV 82.2 80.0 - 100.0 fL   MCH 25.3 (L) 26.0 - 34.0 pg   MCHC 30.7 30.0 - 36.0 g/dL   RDW 48.2 50.0 - 37.0 %   Platelets 335 150 - 400 K/uL   nRBC 0.0 0.0 - 0.2 %    Comment: Performed at [facility], [address] [place], [place] [address]  I-Stat beta hCG blood, ED     Status: None   Collection Time: 02/03/22 10:59 PM  Result Value Ref Range   I-stat hCG, quantitative <5.0 <5 mIU/mL   Comment 3            Comment:   GEST. AGE      CONC.  (mIU/mL)   <=1 WEEK        5 - 50     2 WEEKS       50 - 500     3 WEEKS       100 - 10,000     4 WEEKS      1,000 - 30,000        FEMALE AND NON-PREGNANT FEMALE:     LESS THAN 5 mIU/mL    CT ABDOMEN PELVIS W CONTRAST  Result Date:  02/04/2022 CLINICAL DATA:  Nausea and epigastric pain since Friday. EXAM: CT ABDOMEN AND PELVIS WITH CONTRAST TECHNIQUE: Multidetector CT imaging of the abdomen and pelvis was performed using the standard protocol following bolus administration of intravenous contrast. RADIATION DOSE REDUCTION: This exam was performed according to the departmental dose-optimization program which includes automated exposure control, adjustment of the mA and/or kV according to patient size and/or use of iterative reconstruction technique. CONTRAST:  OMNIPAQUE IOHEXOL 300 MG/ML  SOLN COMPARISON:  None. FINDINGS: Lower chest: No acute abnormality. Hepatobiliary: No focal liver abnormality. Gallstones with markedly thick-walled gallbladder, surrounding inflammatory change, and trace pericholecystic fluid. No biliary dilatation. Pancreas: Unremarkable. No pancreatic ductal dilatation or surrounding inflammatory changes. Spleen: Normal in size without focal abnormality. Adrenals/Urinary Tract: Adrenal glands are unremarkable. Kidneys are normal, without renal calculi, focal lesion, or hydronephrosis. Bladder is unremarkable. Stomach/Bowel: Stomach is within normal limits. Appendix appears normal. No evidence of bowel wall thickening, distention, or inflammatory changes. Vascular/Lymphatic: No significant vascular findings are present. No enlarged abdominal or pelvic lymph nodes. Reproductive: Uterus and bilateral adnexa are unremarkable. Other: No abdominal wall hernia or abnormality. No abdominopelvic ascites. No pneumoperitoneum. Musculoskeletal: No acute or significant osseous findings. IMPRESSION: 1. Acute cholecystitis. Electronically Signed   By: Obie Dredge M.D.   On: 02/04/2022 09:10    Anti-infectives (From admission, onward)    Start     Dose/Rate Route Frequency Ordered Stop   02/04/22  0930  cefTRIAXone (ROCEPHIN) 2 g in sodium chloride 0.9 % 100 mL IVPB        2 g 200 mL/hr over 30 Minutes Intravenous  Once 02/04/22 6144          Assessment/Plan Acute Cholecystitis  - CT with cholelithiasis and markedly thickened gallbladder, surrounding inflammatory change and trace pericholecystic fluid - WBC 10, afebrile - LFTs and Tbili and lipase all normal  - Hx, exam and imaging c/w Acute Cholecystitis. Discussed operative vs non-operative treatment. I have explained the procedure, risks, and aftercare of Laparoscopic cholecystectomy with possible IOC.  Risks include but are not limited to anesthesia (MI, CVA, death), bleeding, infection, wound problems, hernia, bile leak, injury to common bile duct/liver/intestine, increased risk of DVT/PE and diarrhea post op. Discussed possibility of subtotal. She seems to understand and agrees to proceed. Possible discharge from PACU post-operatively. If patient requires admission post-operatively would plan to admit to surgical service for observation   FEN: NPO, IVF ordered  VTE: none ID: [email or web address] ordered  I reviewed ED provider notes, last 24 h vitals and pain scores, last 24 h labs and trends, and last 24 h imaging results.  This care required high  level of medical decision making.   Leary Roca, [facility] 02/04/2022, 9:33 AM Please see Amion for pager number during day hours 7:00am-4:30pm

## 2022-02-04 NOTE — Anesthesia Postprocedure Evaluation (Signed)
Anesthesia Post Note ? ?Patient: Kristin Rios ? ?Procedure(s) Performed: LAPAROSCOPIC CHOLECYSTECTOMY WITH ICG (Abdomen) ? ?  ? ?Patient location during evaluation: PACU ?Anesthesia Type: General ?Level of consciousness: awake ?Pain management: pain level controlled ?Vital Signs Assessment: post-procedure vital signs reviewed and stable ?Respiratory status: spontaneous breathing and respiratory function stable ?Cardiovascular status: stable ?Postop Assessment: no apparent nausea or vomiting ?Anesthetic complications: no ? ? ?No notable events documented. ? ?Last Vitals:  ?Vitals:  ? 02/04/22 1330 02/04/22 1345  ?BP: 124/74 131/75  ?Pulse: 94 80  ?Resp: 17 13  ?Temp:    ?SpO2: 93% 95%  ?  ?Last Pain:  ?Vitals:  ? 02/04/22 1345  ?TempSrc:   ?PainSc: 5   ? ? ?  ?  ?  ?  ?  ?  ? ?Mellody Dance ? ? ? ? ?

## 2022-02-04 NOTE — Transfer of Care (Signed)
Immediate Anesthesia Transfer of Care Note ? ?Patient: Kristin Rios ? ?Procedure(s) Performed: LAPAROSCOPIC CHOLECYSTECTOMY WITH ICG (Abdomen) ? ?Patient Location: PACU ? ?Anesthesia Type:General ? ?Level of Consciousness: drowsy ? ?Airway & Oxygen Therapy: Patient Spontanous Breathing and Patient connected to nasal cannula oxygen ? ?Post-op Assessment: Report given to RN and Post -op Vital signs reviewed and stable ? ?Post vital signs: Reviewed and stable ? ?Last Vitals:  ?Vitals Value Taken Time  ?BP 135/74 02/04/22 1246  ?Temp 37.2 ?C 02/04/22 1245  ?Pulse 90 02/04/22 1250  ?Resp 16 02/04/22 1250  ?SpO2 99 % 02/04/22 1250  ?Vitals shown include unvalidated device data. ? ?Last Pain:  ?Vitals:  ? 02/04/22 1245  ?TempSrc:   ?PainSc: Asleep  ?   ? ?  ? ?Complications: No notable events documented. ?

## 2022-02-04 NOTE — Op Note (Signed)
02/04/2022 ? ?1:12 PM ? ?PATIENT:  Tranesha Lessner  21 y.o. female ? ?PRE-OPERATIVE DIAGNOSIS:  ACUTE CHOLECYSTITIS ? ?POST-OPERATIVE DIAGNOSIS:  ACUTE CHOLECYSTITIS ? ?PROCEDURE:  Procedure(s): ?LAPAROSCOPIC CHOLECYSTECTOMY WITH ICG (N/A) ? ?SURGEON:  Surgeon(s) and Role: ?   Axel Filler, MD - Primary ? ?ASSISTANTS: Trixie Deis, PA-C  ? ?ANESTHESIA:   local and general ? ?EBL:  5 mL  ? ?BLOOD ADMINISTERED:none ? ?DRAINS: none  ? ?LOCAL MEDICATIONS USED:  BUPIVICAINE  ? ?SPECIMEN:  Source of Specimen:  gallbladder ? ?DISPOSITION OF SPECIMEN:  PATHOLOGY ? ?COUNTS:  YES ? ?TOURNIQUET:  * No tourniquets in log * ? ?DICTATION: .Dragon Dictation ? ?The patient was taken to the operating and placed in the supine position with bilateral SCDs in place.  The patient was prepped and draped in the usual sterile fashion. A time out was called and all facts were verified. A pneumoperitoneum was obtained via A Veress needle technique to a pressure of 31mm of mercury. ? ?A 58mm trochar was then placed in the right upper quadrant under visualization, and there were no injuries to any abdominal organs. A 11 mm port was then placed in the umbilical region after infiltrating with local anesthesia under direct visualization. A second and third epigastric port and right lower quadrant port placement under direct visualization, respectively.   ? ?The gallbladder was identified and retracted, the peritoneum was then sharply dissected from the gallbladder and this dissection was carried down to Calot's triangle. The cystic duct was identified and stripped away circumferentially and seen going into the gallbladder 360?, the critical angle was obtained.  2 clips were placed proximally one distally and the cystic duct transected. The cystic artery was identified and 2 clips placed proximally and one distally and transected.  We then proceeded to remove the gallbladder off the hepatic fossa with Bovie cautery. A retrieval bag was then  placed in the abdomen and gallbladder placed in the bag. The hepatic fossa was then reexamined and hemostasis was achieved with Bovie cautery and was excellent at the end of the case. I did place some hemaostatic SNOW in the are of the gallbladder fossa. ? ?The subhepatic fossa and perihepatic fossa was then irrigated until the effluent was clear.  The gallbladder and bag were removed from the abdominal cavity. The 11 mm trocar fascia was reapproximated with the Endo Close #1 Vicryl x3.  The pneumoperitoneum was evacuated and all trochars removed under direct visulalization.  The skin was then closed with 4-0 Monocryl and the skin dressed with Dermabond.   ? ?The patient was awaken from general anesthesia and taken to the recovery room in stable condition. ? ? ?PLAN OF CARE: Admit for overnight observation ? ?PATIENT DISPOSITION:  PACU - hemodynamically stable. ?  ?Delay start of Pharmacological VTE agent (>24hrs) due to surgical blood loss or risk of bleeding: not applicable ? ?

## 2022-02-04 NOTE — Anesthesia Preprocedure Evaluation (Signed)
Anesthesia Evaluation  ?Patient identified by MRN, date of birth, ID band ?Patient awake ? ? ? ?Reviewed: ?Allergy & Precautions, NPO status , Patient's Chart, lab work & pertinent test results ? ?Airway ?Mallampati: II ? ?TM Distance: >3 FB ?Neck ROM: Full ? ? ? Dental ?no notable dental hx. ? ?  ?Pulmonary ?neg pulmonary ROS,  ?  ?Pulmonary exam normal ?breath sounds clear to auscultation ? ? ? ? ? ? Cardiovascular ?hypertension, Pt. on medications ?Normal cardiovascular exam ?Rhythm:Regular Rate:Normal ? ? ?  ?Neuro/Psych ?negative neurological ROS ? negative psych ROS  ? GI/Hepatic ?negative GI ROS, Neg liver ROS,   ?Endo/Other  ?diabetesMorbid obesity ? Renal/GU ?negative Renal ROS  ?negative genitourinary ?  ?Musculoskeletal ?negative musculoskeletal ROS ?(+)  ? Abdominal ?  ?Peds ?negative pediatric ROS ?(+)  Hematology ?negative hematology ROS ?(+)   ?Anesthesia Other Findings ? ? Reproductive/Obstetrics ?negative OB ROS ? ?  ? ? ? ? ? ? ? ? ? ? ? ? ? ?  ?  ? ? ? ? ? ? ? ? ?Anesthesia Physical ?Anesthesia Plan ? ?ASA: 3 ? ?Anesthesia Plan: General  ? ?Post-op Pain Management: Minimal or no pain anticipated and Tylenol PO (pre-op)*  ? ?Induction: Intravenous ? ?PONV Risk Score and Plan: 3 and Treatment may vary due to age or medical condition, Ondansetron, Scopolamine patch - Pre-op, Midazolam and Dexamethasone ? ?Airway Management Planned: Oral ETT ? ?Additional Equipment: None ? ?Intra-op Plan:  ? ?Post-operative Plan: Extubation in OR ? ?Informed Consent: I have reviewed the patients History and Physical, chart, labs and discussed the procedure including the risks, benefits and alternatives for the proposed anesthesia with the patient or authorized representative who has indicated his/her understanding and acceptance.  ? ? ? ?Dental advisory given ? ?Plan Discussed with: CRNA, Anesthesiologist and Surgeon ? ?Anesthesia Plan Comments:   ? ? ? ? ? ? ?Anesthesia Quick  Evaluation ? ?

## 2022-02-04 NOTE — Discharge Instructions (Signed)
[facility] °LAPAROSCOPIC SURGERY: POST OP INSTRUCTIONS °Always review your discharge instruction sheet given to you by the facility where your surgery was performed. °IF YOU HAVE DISABILITY OR FAMILY LEAVE FORMS, YOU MUST BRING THEM TO THE OFFICE FOR PROCESSING.   °DO NOT GIVE THEM TO YOUR DOCTOR. ° °PAIN CONTROL ° °First take acetaminophen (Tylenol) AND/or ibuprofen (Advil) to control your pain after surgery.  Follow directions on package.  Taking acetaminophen (Tylenol) and/or ibuprofen (Advil) regularly after surgery will help to control your pain and lower the amount of prescription pain medication you may need.  You should not take more than 3,000 mg (3 grams) of acetaminophen (Tylenol) in 24 hours.  You should not take ibuprofen (Advil), aleve, motrin, naprosyn or other NSAIDS if you have a history of stomach ulcers or chronic kidney disease.  °A prescription for pain medication may be given to you upon discharge.  Take your pain medication as prescribed, if you still have uncontrolled pain after taking acetaminophen (Tylenol) or ibuprofen (Advil). °Use ice packs to help control pain. °If you need a refill on your pain medication, please contact your pharmacy.  They will contact our office to request authorization. Prescriptions will not be filled after 5pm or on week-ends. ° °HOME MEDICATIONS °Take your usually prescribed medications unless otherwise directed. ° °DIET °You should follow a light diet the first few days after arrival home.  Be sure to include lots of fluids daily. Avoid fatty, fried foods.  ° °CONSTIPATION °It is common to experience some constipation after surgery and if you are taking pain medication.  Increasing fluid intake and taking a stool softener (such as Colace) will usually help or prevent this problem from occurring.  A mild laxative (Milk of Magnesia or Miralax) should be taken according to package instructions if there are no bowel movements after 48  hours. ° °WOUND/INCISION CARE °Most patients will experience some swelling and bruising in the area of the incisions.  Ice packs will help.  Swelling and bruising can take several days to resolve.  °Unless discharge instructions indicate otherwise, follow guidelines below  °STERI-STRIPS - you may remove your outer bandages 48 hours after surgery, and you may shower at that time.  You have steri-strips (small skin tapes) in place directly over the incision.  These strips should be left on the skin for 7-10 days.   °DERMABOND/SKIN GLUE - you may shower in 24 hours.  The glue will flake off over the next 2-3 weeks. °Any sutures or staples will be removed at the office during your follow-up visit. ° °ACTIVITIES °You may resume regular (light) daily activities beginning the next day--such as daily self-care, walking, climbing stairs--gradually increasing activities as tolerated.  You may have sexual intercourse when it is comfortable.  Refrain from any heavy lifting or straining until approved by your doctor. °You may drive when you are no longer taking prescription pain medication, you can comfortably wear a seatbelt, and you can safely maneuver your car and apply brakes. ° °FOLLOW-UP °You should see your doctor in the office for a follow-up appointment approximately 2-3 weeks after your surgery.  You should have been given your post-op/follow-up appointment when your surgery was scheduled.  If you did not receive a post-op/follow-up appointment, make sure that you call for this appointment within a day or two after you arrive home to insure a convenient appointment time. ° ° °WHEN TO CALL YOUR DOCTOR: °Fever over 101.0 °Inability to urinate °Continued bleeding from incision. °  Increased pain, redness, or drainage from the incision. °Increasing abdominal pain ° °The clinic staff is available to answer your questions during regular business hours.  Please don’t hesitate to call and ask to speak to one of the nurses for  clinical concerns.  If you have a medical emergency, go to the nearest emergency room or call 911.  A surgeon from [facility] is always on call at the hospital. °[address], [address], [place], [place]  [address] ? [address] [address], [place], [place]   [address] °[phone] ? [phone] ? FAX [phone] °Web site: [email or web address]  ° ° ° ° °Managing Your Pain After Surgery Without Opioids ° ° ° °Thank you for participating in our program to help patients manage their pain after surgery without opioids. This is part of our effort to provide you with the best care possible, without exposing you or your family to the risk that opioids pose. ° °What pain can I expect after surgery? °You can expect to have some pain after surgery. This is normal. The pain is typically worse the day after surgery, and quickly begins to get better. °Many studies have found that many patients are able to manage their pain after surgery with Over-the-Counter (OTC) medications such as Tylenol and Motrin. If you have a condition that does not allow you to take Tylenol or Motrin, notify your surgical team. ° °How will I manage my pain? °The best strategy for controlling your pain after surgery is around the clock pain control with Tylenol (acetaminophen) and Motrin (ibuprofen or Advil). Alternating these medications with each other allows you to maximize your pain control. In addition to Tylenol and Motrin, you can use heating pads or ice packs on your incisions to help reduce your pain. ° °How will I alternate your regular strength over-the-counter pain medication? °You will take a dose of pain medication every three hours. °Start by taking 650 mg of Tylenol (2 pills of 325 mg) °3 hours later take 600 mg of Motrin (3 pills of 200 mg) °3 hours after taking the Motrin take 650 mg of Tylenol °3 hours after that take 600 mg of Motrin. ° ° °- 1 - ° °See example - if your first dose of Tylenol is at 12:00  PM ° ° °12:00 PM Tylenol 650 mg (2 pills of 325 mg)  °3:00 PM Motrin 600 mg (3 pills of 200 mg)  °6:00 PM Tylenol 650 mg (2 pills of 325 mg)  °9:00 PM Motrin 600 mg (3 pills of 200 mg)  °Continue alternating every 3 hours  ° °We recommend that you follow this schedule around-the-clock for at least 3 days after surgery, or until you feel that it is no longer needed. Use the table on the last page of this handout to keep track of the medications you are taking. °Important: °Do not take more than 3000mg of Tylenol or 3200mg of Motrin in a 24-hour period. °Do not take ibuprofen/Motrin if you have a history of bleeding stomach ulcers, severe kidney disease, &/or actively taking a blood thinner ° °What if I still have pain? °If you have pain that is not controlled with the over-the-counter pain medications (Tylenol and Motrin or Advil) you might have what we call “breakthrough” pain. You will receive a prescription for a small amount of an opioid pain medication such as Oxycodone, Tramadol, or Tylenol with Codeine. Use these opioid pills in the first 24 hours after surgery if you have breakthrough pain. Do   not take more than 1 pill every 4-6 hours. ° °If you still have uncontrolled pain after using all opioid pills, don't hesitate to call our staff using the number provided. We will help make sure you are managing your pain in the best way possible, and if necessary, we can provide a prescription for additional pain medication. ° ° °Day 1   ° °Time  °Name of Medication Number of pills taken  °Amount of Acetaminophen  °Pain Level  ° °Comments  °AM PM       °AM PM       °AM PM       °AM PM       °AM PM       °AM PM       °AM PM       °AM PM       °Total Daily amount of Acetaminophen °Do not take more than  3,000 mg per day    ° ° °Day 2   ° °Time  °Name of Medication Number of pills °taken  °Amount of Acetaminophen  °Pain Level  ° °Comments  °AM PM       °AM PM       °AM PM       °AM PM       °AM PM       °AM PM       °AM  PM       °AM PM       °Total Daily amount of Acetaminophen °Do not take more than  3,000 mg per day    ° ° °Day 3   ° °Time  °Name of Medication Number of pills taken  °Amount of Acetaminophen  °Pain Level  ° °Comments  °AM PM       °AM PM       °AM PM       °AM PM       ° ° ° °AM PM       °AM PM       °AM PM       °AM PM       °Total Daily amount of Acetaminophen °Do not take more than  3,000 mg per day    ° ° °Day 4   ° °Time  °Name of Medication Number of pills taken  °Amount of Acetaminophen  °Pain Level  ° °Comments  °AM PM       °AM PM       °AM PM       °AM PM       °AM PM       °AM PM       °AM PM       °AM PM       °Total Daily amount of Acetaminophen °Do not take more than  3,000 mg per day    ° ° °Day 5   ° °Time  °Name of Medication Number °of pills taken  °Amount of Acetaminophen  °Pain Level  ° °Comments  °AM PM       °AM PM       °AM PM       °AM PM       °AM PM       °AM PM       °AM PM       °AM PM       °Total Daily amount of Acetaminophen °Do not take more than    3,000 mg per day    ° ° ° °Day 6   ° °Time  °Name of Medication Number of pills °taken  °Amount of Acetaminophen  °Pain Level  °Comments  °AM PM       °AM PM       °AM PM       °AM PM       °AM PM       °AM PM       °AM PM       °AM PM       °Total Daily amount of Acetaminophen °Do not take more than  3,000 mg per day    ° ° °Day 7   ° °Time  °Name of Medication Number of pills taken  °Amount of Acetaminophen  °Pain Level  ° °Comments  °AM PM       °AM PM       °AM PM       °AM PM       °AM PM       °AM PM       °AM PM       °AM PM       °Total Daily amount of Acetaminophen °Do not take more than  3,000 mg per day    ° ° ° ° °For additional information about how and where to safely dispose of unused opioid °medications - https://www.morepowerfulnc.org ° °Disclaimer: This document contains information and/or instructional materials adapted from [facility] for the typical patient with your condition. It does not replace medical advice  from your health care provider because your experience may differ from that of the °typical patient. Talk to your health care provider if you have any questions about this °document, your condition or your treatment plan. °Adapted from [facility]  °

## 2022-02-05 ENCOUNTER — Encounter (HOSPITAL_COMMUNITY): Payer: Self-pay | Admitting: General Surgery

## 2022-02-05 LAB — COMPREHENSIVE METABOLIC PANEL
ALT: 47 U/L — ABNORMAL HIGH (ref 0–44)
AST: 36 U/L (ref 15–41)
Albumin: 3.1 g/dL — ABNORMAL LOW (ref 3.5–5.0)
Alkaline Phosphatase: 64 U/L (ref 38–126)
Anion gap: 10 (ref 5–15)
BUN: 5 mg/dL — ABNORMAL LOW (ref 6–20)
CO2: 27 mmol/L (ref 22–32)
Calcium: 8.7 mg/dL — ABNORMAL LOW (ref 8.9–10.3)
Chloride: 102 mmol/L (ref 98–111)
Creatinine, Ser: 0.53 mg/dL (ref 0.44–1.00)
GFR, Estimated: >60 mL/min (ref >60)
Glucose, Bld: 124 mg/dL — ABNORMAL HIGH (ref 70–99)
Potassium: 3.6 mmol/L (ref 3.5–5.1)
Sodium: 139 mmol/L (ref 135–145)
Total Bilirubin: 0.3 mg/dL (ref 0.3–1.2)
Total Protein: 6.9 g/dL (ref 6.5–8.1)

## 2022-02-05 LAB — CBC
HCT: 35.2 % — ABNORMAL LOW (ref 36.0–46.0)
Hemoglobin: 11.3 g/dL — ABNORMAL LOW (ref 12.0–15.0)
MCH: 26.2 pg (ref 26.0–34.0)
MCHC: 32.1 g/dL (ref 30.0–36.0)
MCV: 81.7 fL (ref 80.0–100.0)
Platelets: 293 10*3/uL (ref 150–400)
RBC: 4.31 MIL/uL (ref 3.87–5.11)
RDW: 14.8 % (ref 11.5–15.5)
WBC: 12.9 10*3/uL — ABNORMAL HIGH (ref 4.0–10.5)
nRBC: 0 % (ref 0.0–0.2)

## 2022-02-05 LAB — SURGICAL PATHOLOGY

## 2022-02-05 MED ORDER — ACETAMINOPHEN 500 MG PO TABS: 1000.0000 mg | ORAL_TABLET | Freq: Three times a day (TID) | ORAL | 0 refills | Status: AC | PRN

## 2022-02-05 MED ORDER — OXYCODONE HCL 5 MG PO TABS: 5.0000 mg | ORAL_TABLET | Freq: Four times a day (QID) | ORAL | 0 refills | Status: AC | PRN

## 2022-02-05 NOTE — Discharge Summary (Signed)
? ? ?Patient ID: ?Kristin Rios ?[id number] ?July 05, 2001 21 y.o. ? ?Admit date: 02/03/2022 ?Discharge date: 02/05/2022 ? ?Admitting Diagnosis: ?Acute Cholecystitis  ? ?Discharge Diagnosis ?Acute Cholecystitis  ? ?Consultants ?None ? ?Reason for Admission: ?Patient is a 21 year old female who presented to [facility] with abdominal pain. ~1.5 months ago she had intermittent, episodes of epigastric abdominal pain that would resolve a few hours after taking ibuprofen. Sunday her symptoms recurred but self resolved. Yesterday she had another attack of non-radiating epigastric abdominal pain, that did not resolve, was more severe (8/10) and now associated with intermittent nausea and emesis. Emesis was NBNB. Symptoms began after PO intake and were worsened with PO intake. She denied fever, chills, chest pain, SOB, urinary symptoms. Patient denies prior abdominal surgery. Recently delivered a baby vaginally in November of 2022. She is not breast feeding. No blood thinning medications. NKDA.  ? ?Procedures ?Dr. Rosendo Gros - Laparoscopic Cholecystectomy - 02/04/2022 ? ?Hospital Course:  ?The patient was admitted and underwent a laparoscopic cholecystectomy. SNOW was placed in the GB fossa at the time of surgery. The patient tolerated the procedure well.  On POD 1, the patient was tolerating a diet, voiding well, mobilizing, VSS, incisions c/d/I and pain was controlled with oral pain medications.  The patient was stable for DC home at this time with appropriate follow up made. Return and discharge instructions discussed. A note was provided for work.  ? ?Physical Exam: ?Gen:  Alert, NAD, pleasant ?HEENT: EOM's intact, pupils equal and round ?Card:  RRR ?Pulm:  CTAB, no W/R/R, effort normal ?Abd: Soft, ND, appropriately tender around laparoscopic incisions. Otherwise NT. No peritonitis. +BS. Incisions with glue intact appears well and are without drainage, bleeding, or signs of infection ?Ext:  No LE edema  ?Psych: A&Ox3  ?Skin: no  rashes noted, warm and dry ? ? ?Allergies as of 02/05/2022   ?No Known Allergies ?  ? ?  ?Medication List  ?  ? ?TAKE these medications   ? ?acetaminophen 500 MG tablet ?Commonly known as: TYLENOL ?Take 2 tablets (1,000 mg total) by mouth every 8 (eight) hours as needed. ?What changed:  ?medication strength ?how much to take ?when to take this ?reasons to take this ?  ?Blood Pressure Kit Devi ?1 kit by Does not apply route once a week. ?  ?FeroSul 325 (65 FE) MG tablet ?Generic drug: ferrous sulfate ?Take 1 tablet (325 mg total) by mouth every other day. ?  ?ibuprofen 200 MG tablet ?Commonly known as: ADVIL ?Take 600 mg by mouth every 6 (six) hours as needed for moderate pain. ?What changed: Another medication with the same name was removed. Continue taking this medication, and follow the directions you see here. ?  ?NIFEdipine 30 MG 24 hr tablet ?Commonly known as: PROCARDIA-XL/NIFEDICAL-XL ?Take 1 tablet (30 mg total) by mouth daily. ?  ?oxyCODONE 5 MG immediate release tablet ?Commonly known as: Oxy IR/ROXICODONE ?Take 1 tablet (5 mg total) by mouth every 6 (six) hours as needed for breakthrough pain. ?  ?PrePLUS 27-1 MG Tabs ?Take 1 tablet by mouth daily. ?  ?Senexon-S 8.6-50 MG tablet ?Generic drug: senna-docusate ?Take 2 tablets by mouth at bedtime as needed for mild constipation. ?  ?V-R GAS RELIEF 80 MG chewable tablet ?Generic drug: simethicone ?Chew 1 tablet (80 mg total) by mouth as needed for flatulence. ?  ? ?  ? ? ? ? Follow-up Information   ? ? [facility], [place]. Go on 02/26/2022.   ?Specialty: General Surgery ?Why: 2:30  PM. Please arrive 30 min prior to appointment time and have ID and insurance information with you. ?Contact information: ?[address] ?[address] ?[place] [address] ?[phone] ? ? ?  ?  ? ?  ?  ? ?  ? ? ?Signed: ?Alferd Apa, PA-C ?[facility] ?02/05/2022, 9:16 AM ?Please see Amion for pager number during day hours 7:00am-4:30pm ? ? ?

## 2022-02-05 NOTE — Progress Notes (Signed)
Patient alert and oriented, mae's well, voiding adequate amount of urine, swallowing without difficulty, no c/o pain at time of discharge. Patient discharged home with family. Script and discharged instructions given to patient. Patient and family stated understanding of instructions given. Patient has an appointment with Dr. Ramirez  

## 2022-02-05 NOTE — Plan of Care (Signed)
WNL

## 2022-05-12 IMAGING — US US FETAL BPP W/ NON-STRESS
1 series · 13 of 13 positions shown · non-contrast
Comparison: none

[Series 1: us fetal bpp w/ non-stress · 13 acquisitions, 13 frames shown]
[im 1/13]
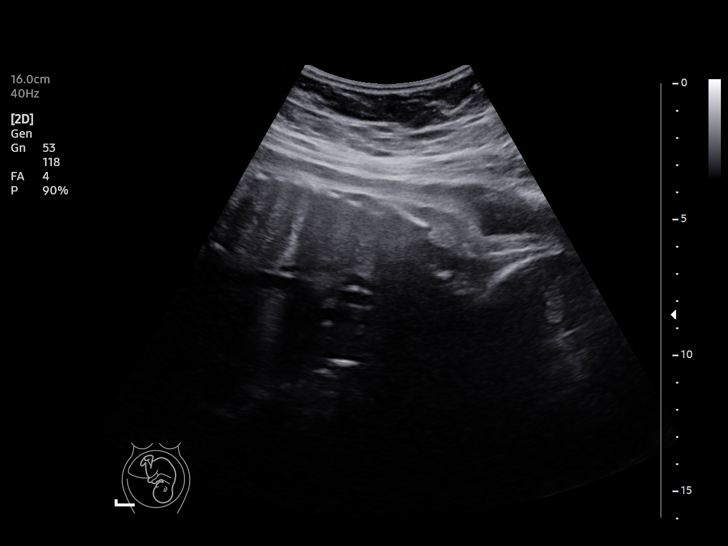
[im 2/13]
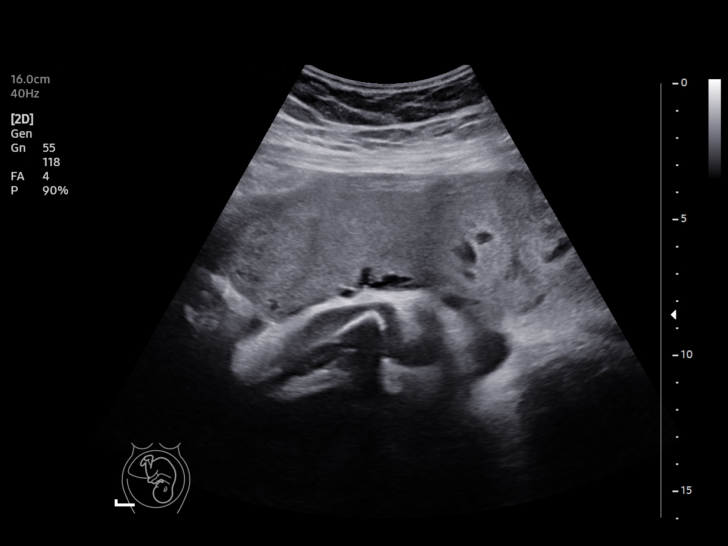
[im 3/13]
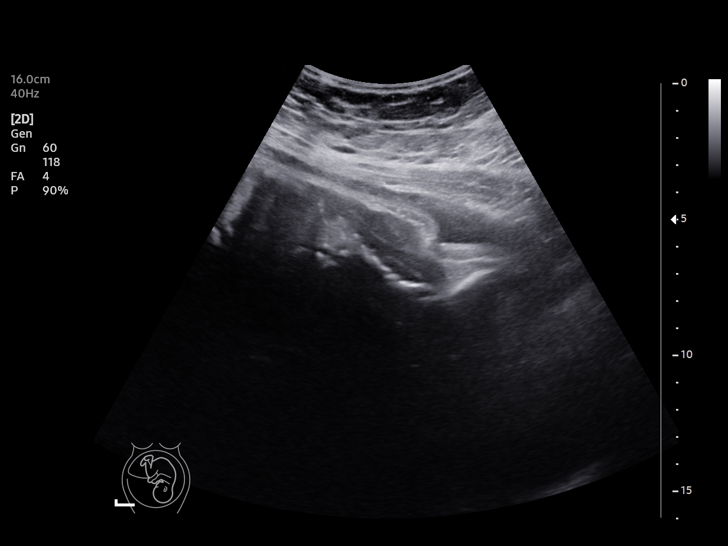
[im 4/13]
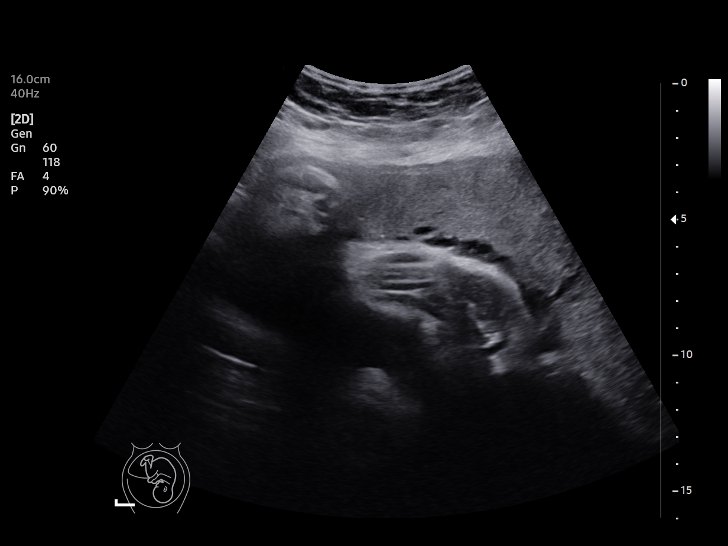
[im 5/13]
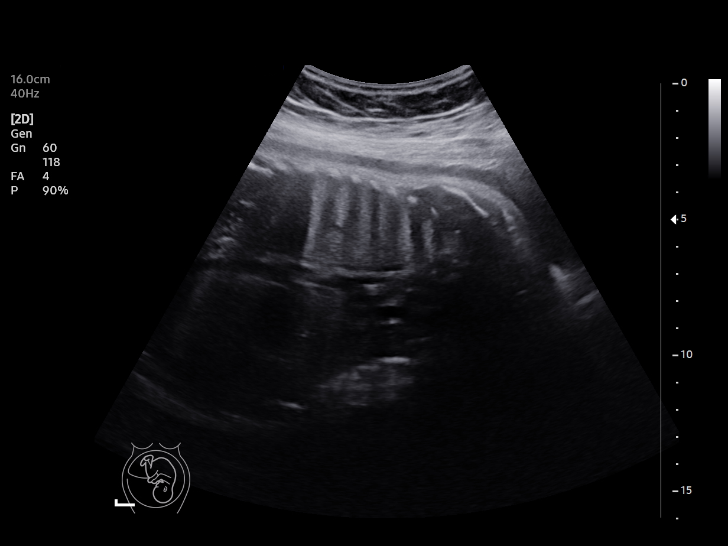
[im 6/13]
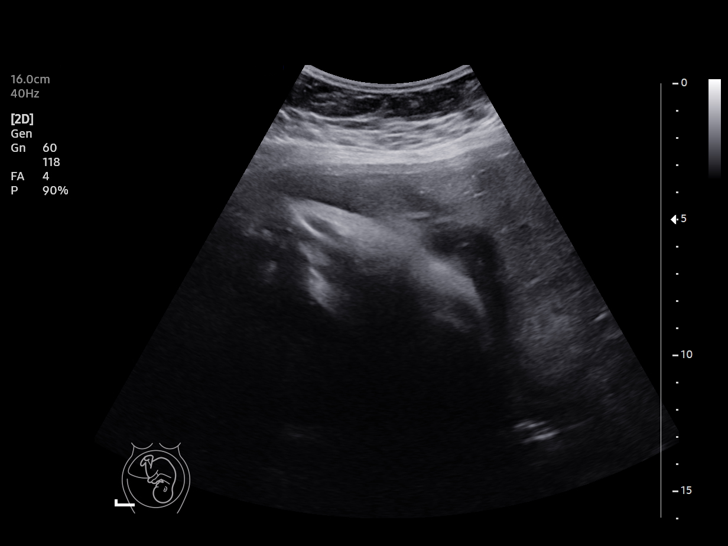
[im 7/13]
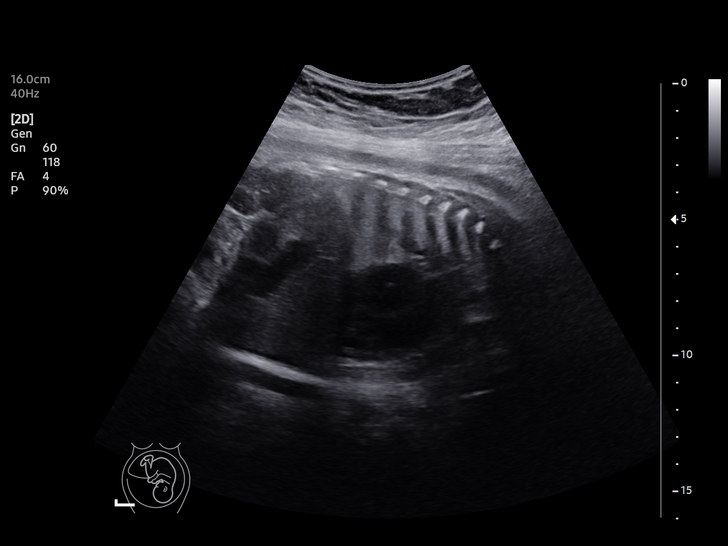
[im 8/13]
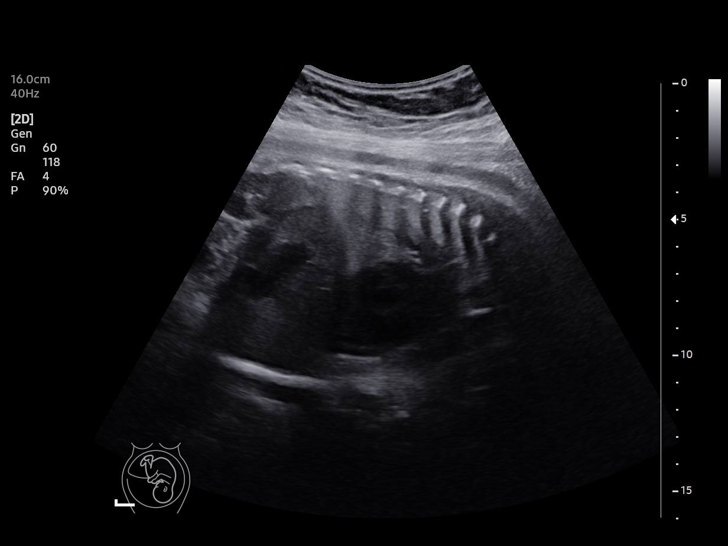
[im 9/13]
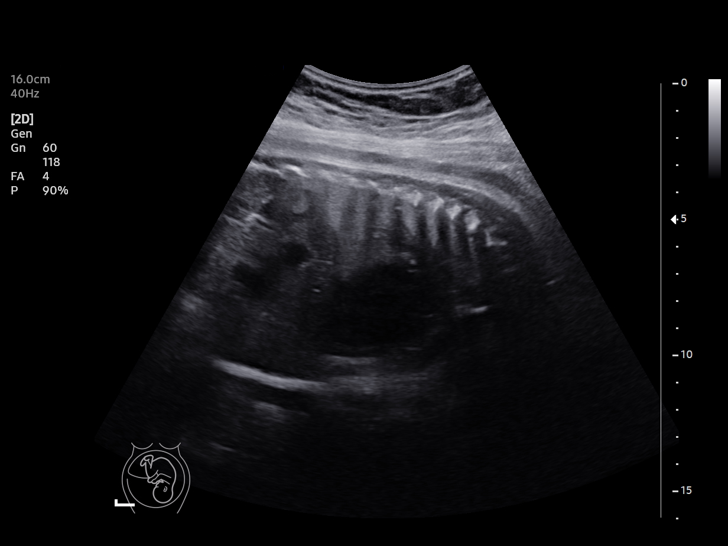
[im 10/13]
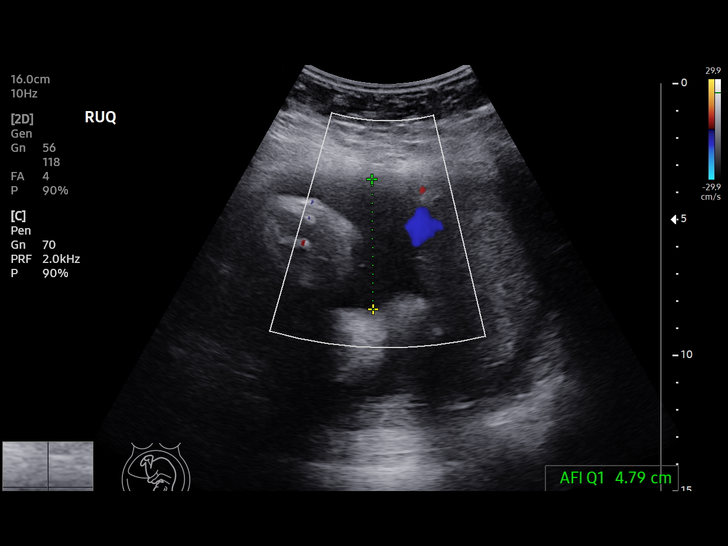
[im 11/13]
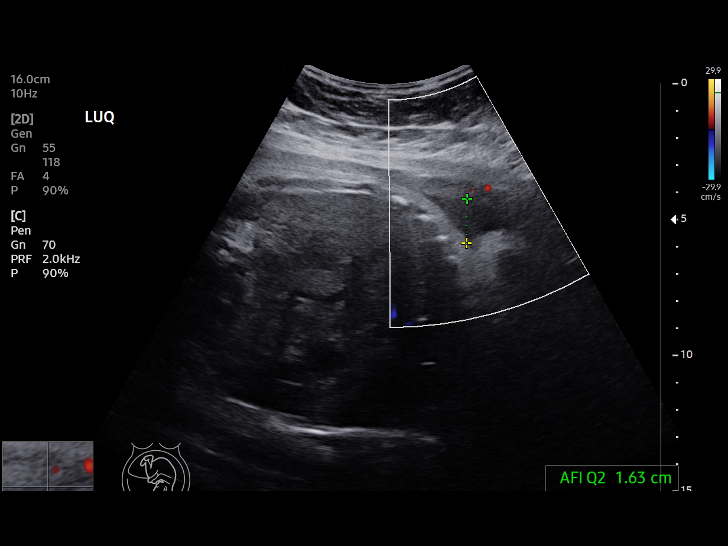
[im 12/13]
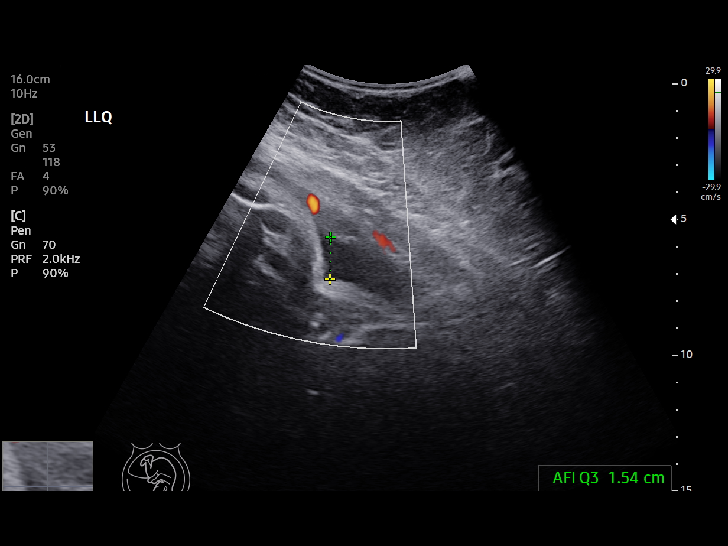
[im 13/13]
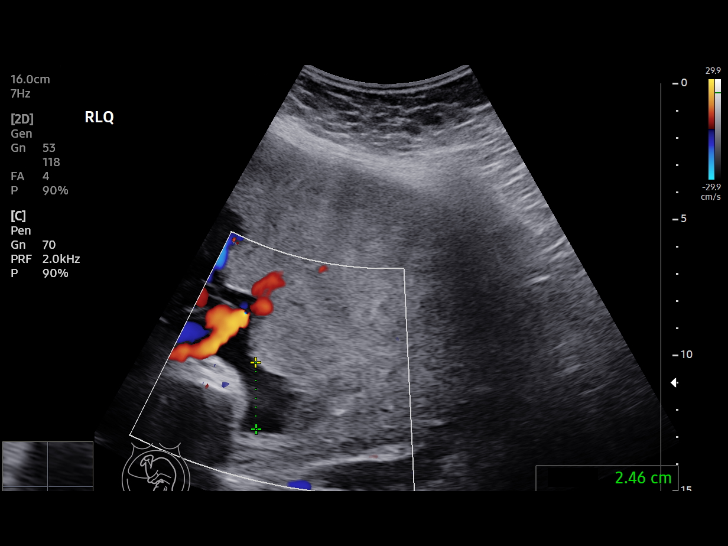

[13 of 13 positions shown; findings below may reference images not displayed]

MACIEL CNM                             [REDACTED]care at
                   [facility] [HOSPITAL]

Service(s) Provided

Indications

 36 weeks gestation of pregnancy
 Gestational diabetes in pregnancy,
 controlled by oral hypoglycemic drugs
Vital Signs

                                                Height:        5'5"
Fetal Evaluation

 Num Of Fetuses:         1
 Preg. Location:         Intrauterine
 Cardiac Activity:       Observed
 Presentation:           Cephalic

 Amniotic Fluid
 AFI FV:      Within normal limits

 AFI Sum(cm)     %Tile       Largest Pocket(cm)
 10.42           25
 RUQ(cm)       RLQ(cm)       LUQ(cm)        LLQ(cm)

Biophysical Evaluation

 Amniotic F.V:   Pocket => 2 cm             F. Tone:        Observed
 F. Movement:    Observed                   N.S.T:          Reactive
 F. Breathing:   Observed                   Score:          [DATE]
OB History

 Gravidity:    1
Gestational Age

 LMP:           34w 6d        Date:  01/25/21                 EDD:   11/01/21
 Best:          36w 0d     Det. By:  U/S C R L  (04/07/21)    EDD:   10/24/21
Impression

 Antenatal testing due to GDM on metformin.
 Testing is reassuring, BPP [DATE].
Recommendations

 Continue weekly antenatal testing till delivery .
               Fletes, Aris

## 2022-09-20 IMAGING — CT CT ABD-PELV W/ CM
2 of 4 series · 16 of 46 positions shown, 18 images · IV contrast (agent unspecified)
Comparison: None.

CLINICAL DATA: Nausea and epigastric pain since [REDACTED].

EXAM:
CT ABDOMEN AND PELVIS WITH CONTRAST
TECHNIQUE: Multidetector CT imaging of the abdomen and pelvis was performed
using the standard protocol following bolus administration of
intravenous contrast.

[Series 3: a/p w/ 5mm · axial · 0.92mm/px · z∈[+773,+1258]mm · 13 of 107 slices shown, 15 images]
[im 5/107  soft-tissue]
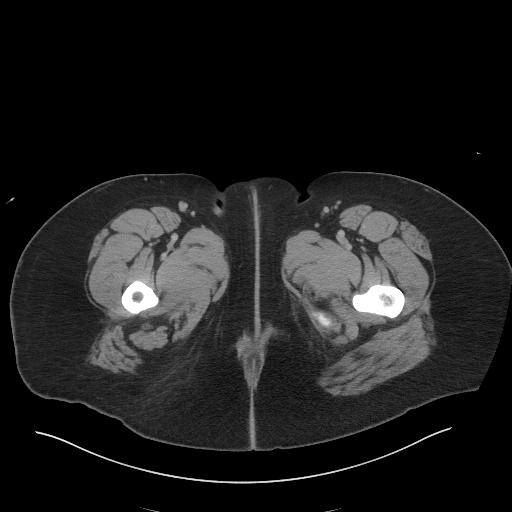
[im 5/107  bone]
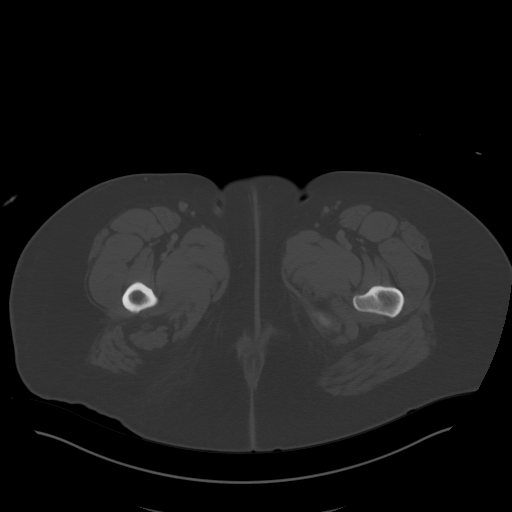
[im 13/107  soft-tissue]
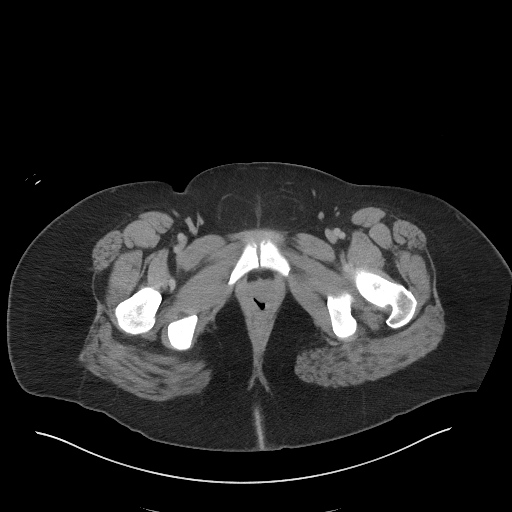
[im 21/107  soft-tissue]
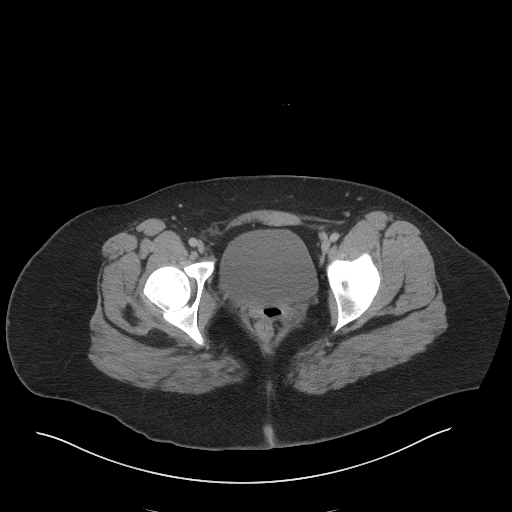
[im 29/107  soft-tissue]
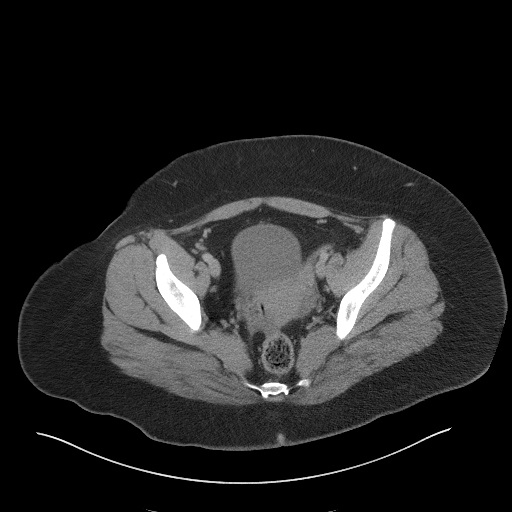
[im 37/107  soft-tissue]
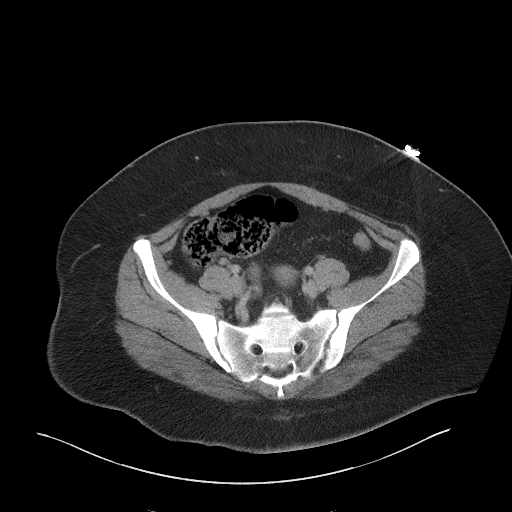
[im 45/107  soft-tissue]
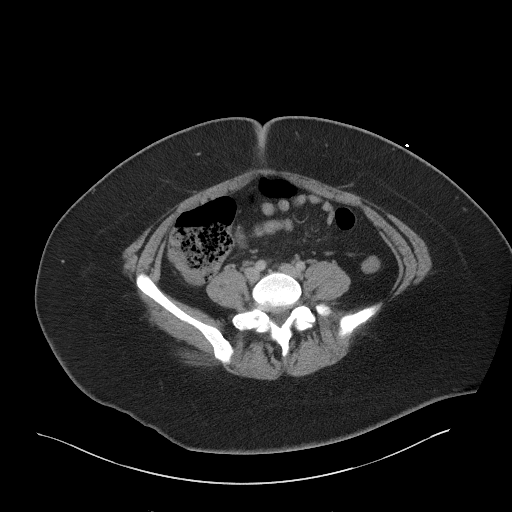
[im 54/107  soft-tissue]
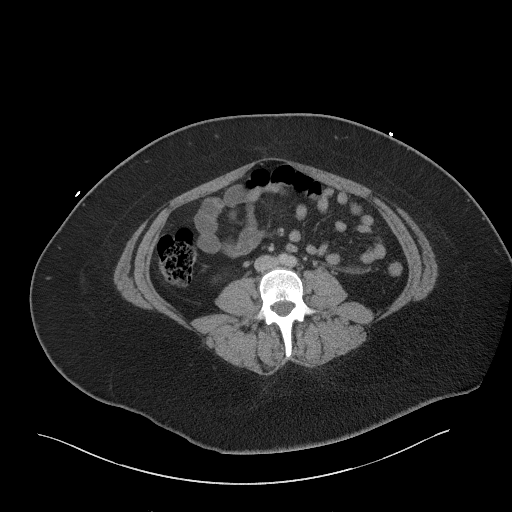
[im 62/107  soft-tissue]
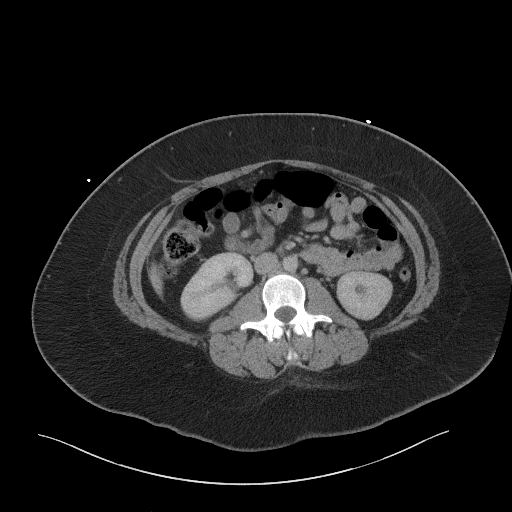
[im 70/107  soft-tissue]
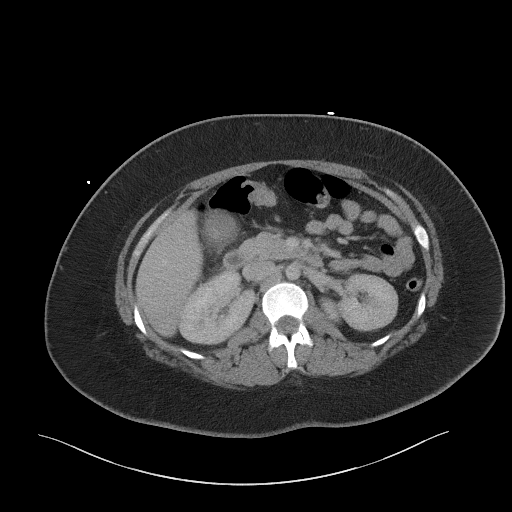
[im 70/107  bone]
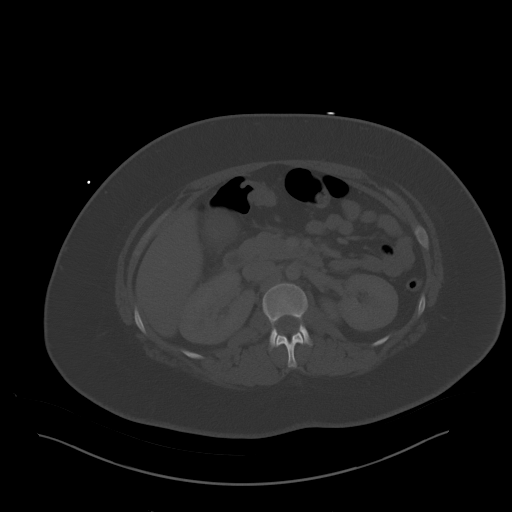
[im 78/107  soft-tissue]
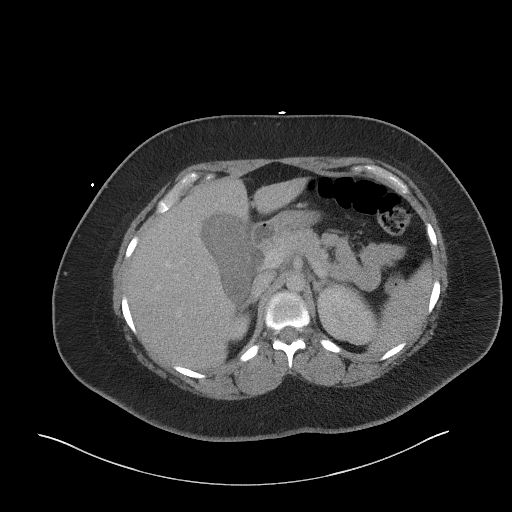
[im 86/107  soft-tissue]
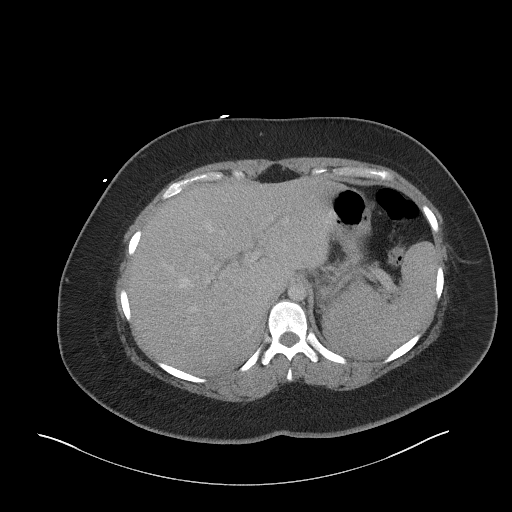
[im 94/107  soft-tissue]
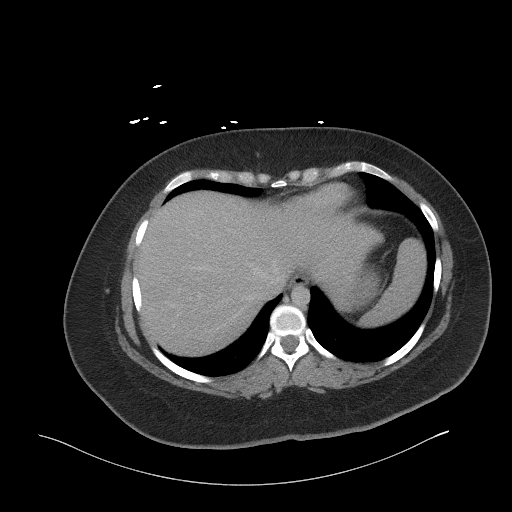
[im 102/107  soft-tissue]
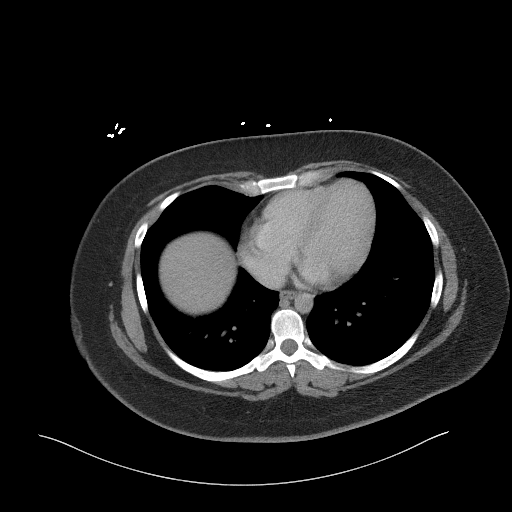

[Series 6: a/p w/ cor · coronal · 0.90mm/px · 3 of 153 slices shown]
[im 51/153  soft-tissue]
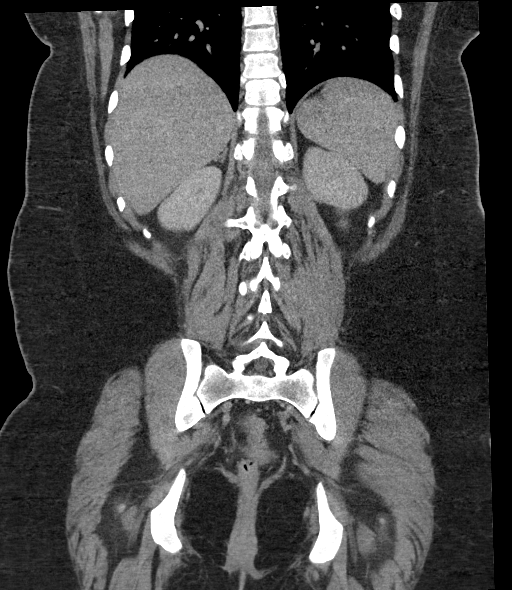
[im 68/153  soft-tissue]
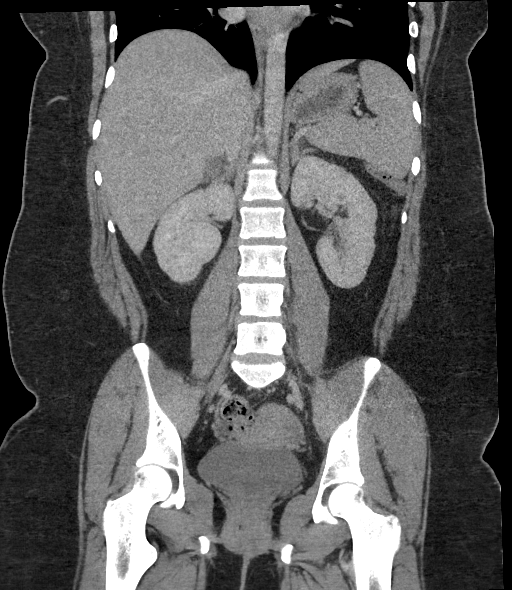
[im 85/153  soft-tissue]
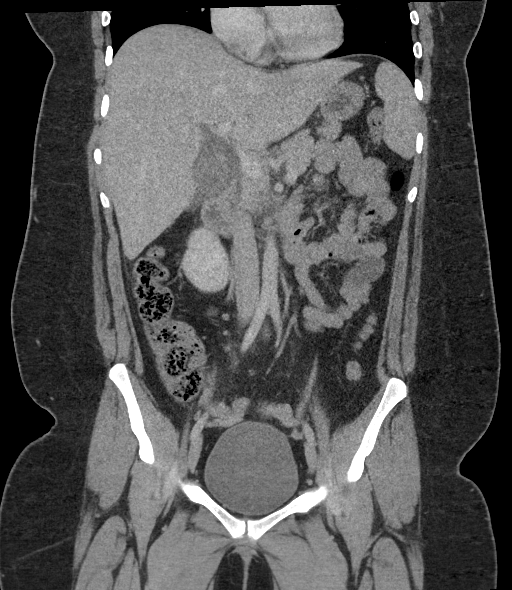

[16 of 46 positions shown; findings below may reference images not displayed]

RADIATION DOSE REDUCTION: This exam was performed according to the
departmental dose-optimization program which includes automated
exposure control, adjustment of the mA and/or kV according to
patient size and/or use of iterative reconstruction technique.

CONTRAST:  100mL OMNIPAQUE IOHEXOL 300 MG/ML  SOLN
FINDINGS: Lower chest: No acute abnormality.

Hepatobiliary: No focal liver abnormality. Gallstones with markedly
thick-walled gallbladder, surrounding inflammatory change, and trace
pericholecystic fluid. No biliary dilatation.

Pancreas: Unremarkable. No pancreatic ductal dilatation or
surrounding inflammatory changes.

Spleen: Normal in size without focal abnormality.

Adrenals/Urinary Tract: Adrenal glands are unremarkable. Kidneys are
normal, without renal calculi, focal lesion, or hydronephrosis.
Bladder is unremarkable.

Stomach/Bowel: Stomach is within normal limits. Appendix appears
normal. No evidence of bowel wall thickening, distention, or
inflammatory changes.

Vascular/Lymphatic: No significant vascular findings are present. No
enlarged abdominal or pelvic lymph nodes.

Reproductive: Uterus and bilateral adnexa are unremarkable.

Other: No abdominal wall hernia or abnormality. No abdominopelvic
ascites. No pneumoperitoneum.

Musculoskeletal: No acute or significant osseous findings.
IMPRESSION: 1. Acute cholecystitis.
